# Patient Record
Sex: Female | Born: 1970
Health system: Southern US, Community
[De-identification: ages and names within clinical notes are randomized; demographics above are authoritative.]

## PROBLEM LIST (undated history)

## (undated) DIAGNOSIS — E7800 Pure hypercholesterolemia, unspecified: Secondary | ICD-10-CM

## (undated) DIAGNOSIS — K219 Gastro-esophageal reflux disease without esophagitis: Secondary | ICD-10-CM

## (undated) DIAGNOSIS — G249 Dystonia, unspecified: Secondary | ICD-10-CM

## (undated) DIAGNOSIS — F419 Anxiety disorder, unspecified: Secondary | ICD-10-CM

## (undated) DIAGNOSIS — G43909 Migraine, unspecified, not intractable, without status migrainosus: Secondary | ICD-10-CM

## (undated) DIAGNOSIS — F32A Depression, unspecified: Secondary | ICD-10-CM

## (undated) DIAGNOSIS — T4145XA Adverse effect of unspecified anesthetic, initial encounter: Secondary | ICD-10-CM

## (undated) DIAGNOSIS — M1990 Unspecified osteoarthritis, unspecified site: Secondary | ICD-10-CM

## (undated) DIAGNOSIS — E063 Autoimmune thyroiditis: Secondary | ICD-10-CM

## (undated) DIAGNOSIS — M069 Rheumatoid arthritis, unspecified: Secondary | ICD-10-CM

## (undated) DIAGNOSIS — T8859XA Other complications of anesthesia, initial encounter: Secondary | ICD-10-CM

## (undated) DIAGNOSIS — G245 Blepharospasm: Secondary | ICD-10-CM

## (undated) DIAGNOSIS — M797 Fibromyalgia: Secondary | ICD-10-CM

## (undated) DIAGNOSIS — F329 Major depressive disorder, single episode, unspecified: Secondary | ICD-10-CM

## (undated) HISTORY — DX: Blepharospasm: G24.5

## (undated) HISTORY — DX: Pure hypercholesterolemia, unspecified: E78.00

## (undated) HISTORY — DX: Anxiety disorder, unspecified: F41.9

## (undated) HISTORY — DX: Autoimmune thyroiditis: E06.3

## (undated) HISTORY — DX: Unspecified osteoarthritis, unspecified site: M19.90

## (undated) HISTORY — DX: Migraine, unspecified, not intractable, without status migrainosus: G43.909

## (undated) HISTORY — DX: Major depressive disorder, single episode, unspecified: F32.9

## (undated) HISTORY — DX: Fibromyalgia: M79.7

## (undated) HISTORY — DX: Depression, unspecified: F32.A

## (undated) HISTORY — DX: Dystonia, unspecified: G24.9

## (undated) HISTORY — DX: Rheumatoid arthritis, unspecified: M06.9

## (undated) HISTORY — DX: Gastro-esophageal reflux disease without esophagitis: K21.9

---

## 2003-10-09 HISTORY — PX: SPINE SURGERY: SHX786

## 2011-10-15 DIAGNOSIS — R259 Unspecified abnormal involuntary movements: Secondary | ICD-10-CM | POA: Diagnosis not present

## 2011-10-23 DIAGNOSIS — N390 Urinary tract infection, site not specified: Secondary | ICD-10-CM | POA: Diagnosis not present

## 2011-10-23 DIAGNOSIS — B373 Candidiasis of vulva and vagina: Secondary | ICD-10-CM | POA: Diagnosis not present

## 2011-10-30 DIAGNOSIS — Z3009 Encounter for other general counseling and advice on contraception: Secondary | ICD-10-CM | POA: Diagnosis not present

## 2011-10-30 DIAGNOSIS — R300 Dysuria: Secondary | ICD-10-CM | POA: Diagnosis not present

## 2011-11-27 DIAGNOSIS — G241 Genetic torsion dystonia: Secondary | ICD-10-CM | POA: Diagnosis not present

## 2011-12-24 DIAGNOSIS — G248 Other dystonia: Secondary | ICD-10-CM | POA: Diagnosis not present

## 2011-12-24 DIAGNOSIS — G243 Spasmodic torticollis: Secondary | ICD-10-CM | POA: Diagnosis not present

## 2011-12-24 DIAGNOSIS — M62838 Other muscle spasm: Secondary | ICD-10-CM | POA: Diagnosis not present

## 2011-12-24 DIAGNOSIS — R209 Unspecified disturbances of skin sensation: Secondary | ICD-10-CM | POA: Diagnosis not present

## 2011-12-27 DIAGNOSIS — M7989 Other specified soft tissue disorders: Secondary | ICD-10-CM | POA: Diagnosis not present

## 2011-12-27 DIAGNOSIS — M25579 Pain in unspecified ankle and joints of unspecified foot: Secondary | ICD-10-CM | POA: Diagnosis not present

## 2011-12-27 DIAGNOSIS — M79609 Pain in unspecified limb: Secondary | ICD-10-CM | POA: Diagnosis not present

## 2012-01-03 DIAGNOSIS — R259 Unspecified abnormal involuntary movements: Secondary | ICD-10-CM | POA: Diagnosis not present

## 2012-01-14 DIAGNOSIS — R209 Unspecified disturbances of skin sensation: Secondary | ICD-10-CM | POA: Diagnosis not present

## 2012-01-14 DIAGNOSIS — R259 Unspecified abnormal involuntary movements: Secondary | ICD-10-CM | POA: Diagnosis not present

## 2012-01-17 DIAGNOSIS — M5124 Other intervertebral disc displacement, thoracic region: Secondary | ICD-10-CM | POA: Diagnosis not present

## 2012-01-17 DIAGNOSIS — M5020 Other cervical disc displacement, unspecified cervical region: Secondary | ICD-10-CM | POA: Diagnosis not present

## 2012-01-29 DIAGNOSIS — R52 Pain, unspecified: Secondary | ICD-10-CM | POA: Diagnosis not present

## 2012-01-29 DIAGNOSIS — IMO0001 Reserved for inherently not codable concepts without codable children: Secondary | ICD-10-CM | POA: Diagnosis not present

## 2012-01-29 DIAGNOSIS — G248 Other dystonia: Secondary | ICD-10-CM | POA: Diagnosis not present

## 2012-01-30 DIAGNOSIS — M546 Pain in thoracic spine: Secondary | ICD-10-CM | POA: Diagnosis not present

## 2012-03-24 DIAGNOSIS — R259 Unspecified abnormal involuntary movements: Secondary | ICD-10-CM | POA: Diagnosis not present

## 2012-03-24 DIAGNOSIS — G8929 Other chronic pain: Secondary | ICD-10-CM | POA: Diagnosis not present

## 2012-03-24 DIAGNOSIS — M79609 Pain in unspecified limb: Secondary | ICD-10-CM | POA: Diagnosis not present

## 2012-03-24 DIAGNOSIS — F17200 Nicotine dependence, unspecified, uncomplicated: Secondary | ICD-10-CM | POA: Diagnosis not present

## 2012-04-03 DIAGNOSIS — R209 Unspecified disturbances of skin sensation: Secondary | ICD-10-CM | POA: Diagnosis not present

## 2012-04-03 DIAGNOSIS — G243 Spasmodic torticollis: Secondary | ICD-10-CM | POA: Diagnosis not present

## 2012-04-03 DIAGNOSIS — IMO0001 Reserved for inherently not codable concepts without codable children: Secondary | ICD-10-CM | POA: Diagnosis not present

## 2012-04-07 DIAGNOSIS — R209 Unspecified disturbances of skin sensation: Secondary | ICD-10-CM | POA: Diagnosis not present

## 2012-04-07 DIAGNOSIS — G243 Spasmodic torticollis: Secondary | ICD-10-CM | POA: Diagnosis not present

## 2012-04-07 DIAGNOSIS — IMO0001 Reserved for inherently not codable concepts without codable children: Secondary | ICD-10-CM | POA: Diagnosis not present

## 2012-04-14 DIAGNOSIS — IMO0001 Reserved for inherently not codable concepts without codable children: Secondary | ICD-10-CM | POA: Diagnosis not present

## 2012-04-14 DIAGNOSIS — R209 Unspecified disturbances of skin sensation: Secondary | ICD-10-CM | POA: Diagnosis not present

## 2012-04-14 DIAGNOSIS — G243 Spasmodic torticollis: Secondary | ICD-10-CM | POA: Diagnosis not present

## 2012-04-17 DIAGNOSIS — S8990XA Unspecified injury of unspecified lower leg, initial encounter: Secondary | ICD-10-CM | POA: Diagnosis not present

## 2012-04-17 DIAGNOSIS — S93609A Unspecified sprain of unspecified foot, initial encounter: Secondary | ICD-10-CM | POA: Diagnosis not present

## 2012-04-17 DIAGNOSIS — R259 Unspecified abnormal involuntary movements: Secondary | ICD-10-CM | POA: Diagnosis not present

## 2012-04-17 DIAGNOSIS — S99929A Unspecified injury of unspecified foot, initial encounter: Secondary | ICD-10-CM | POA: Diagnosis not present

## 2012-04-17 DIAGNOSIS — S93409A Sprain of unspecified ligament of unspecified ankle, initial encounter: Secondary | ICD-10-CM | POA: Diagnosis not present

## 2012-04-17 DIAGNOSIS — G241 Genetic torsion dystonia: Secondary | ICD-10-CM | POA: Diagnosis not present

## 2012-04-21 DIAGNOSIS — R209 Unspecified disturbances of skin sensation: Secondary | ICD-10-CM | POA: Diagnosis not present

## 2012-04-21 DIAGNOSIS — G243 Spasmodic torticollis: Secondary | ICD-10-CM | POA: Diagnosis not present

## 2012-04-21 DIAGNOSIS — IMO0001 Reserved for inherently not codable concepts without codable children: Secondary | ICD-10-CM | POA: Diagnosis not present

## 2012-04-29 DIAGNOSIS — R259 Unspecified abnormal involuntary movements: Secondary | ICD-10-CM | POA: Diagnosis not present

## 2012-04-29 DIAGNOSIS — G43909 Migraine, unspecified, not intractable, without status migrainosus: Secondary | ICD-10-CM | POA: Diagnosis not present

## 2012-05-06 DIAGNOSIS — M62838 Other muscle spasm: Secondary | ICD-10-CM | POA: Diagnosis not present

## 2012-05-06 DIAGNOSIS — IMO0001 Reserved for inherently not codable concepts without codable children: Secondary | ICD-10-CM | POA: Diagnosis not present

## 2012-05-06 DIAGNOSIS — G241 Genetic torsion dystonia: Secondary | ICD-10-CM | POA: Diagnosis not present

## 2012-05-12 DIAGNOSIS — R209 Unspecified disturbances of skin sensation: Secondary | ICD-10-CM | POA: Diagnosis not present

## 2012-05-12 DIAGNOSIS — IMO0001 Reserved for inherently not codable concepts without codable children: Secondary | ICD-10-CM | POA: Diagnosis not present

## 2012-05-12 DIAGNOSIS — G243 Spasmodic torticollis: Secondary | ICD-10-CM | POA: Diagnosis not present

## 2012-05-16 DIAGNOSIS — G243 Spasmodic torticollis: Secondary | ICD-10-CM | POA: Diagnosis not present

## 2012-05-20 DIAGNOSIS — S93409A Sprain of unspecified ligament of unspecified ankle, initial encounter: Secondary | ICD-10-CM | POA: Diagnosis not present

## 2012-05-20 DIAGNOSIS — R259 Unspecified abnormal involuntary movements: Secondary | ICD-10-CM | POA: Diagnosis not present

## 2012-07-28 DIAGNOSIS — Z79899 Other long term (current) drug therapy: Secondary | ICD-10-CM | POA: Diagnosis not present

## 2012-07-28 DIAGNOSIS — IMO0001 Reserved for inherently not codable concepts without codable children: Secondary | ICD-10-CM | POA: Diagnosis not present

## 2012-07-28 DIAGNOSIS — R5381 Other malaise: Secondary | ICD-10-CM | POA: Diagnosis not present

## 2012-07-28 DIAGNOSIS — G245 Blepharospasm: Secondary | ICD-10-CM | POA: Diagnosis not present

## 2012-07-28 DIAGNOSIS — R259 Unspecified abnormal involuntary movements: Secondary | ICD-10-CM | POA: Diagnosis not present

## 2012-07-28 DIAGNOSIS — G248 Other dystonia: Secondary | ICD-10-CM | POA: Diagnosis not present

## 2012-08-15 DIAGNOSIS — F4320 Adjustment disorder, unspecified: Secondary | ICD-10-CM | POA: Diagnosis not present

## 2012-08-15 DIAGNOSIS — F4542 Pain disorder with related psychological factors: Secondary | ICD-10-CM | POA: Diagnosis not present

## 2012-09-11 DIAGNOSIS — H52229 Regular astigmatism, unspecified eye: Secondary | ICD-10-CM | POA: Diagnosis not present

## 2012-09-11 DIAGNOSIS — Z0100 Encounter for examination of eyes and vision without abnormal findings: Secondary | ICD-10-CM | POA: Diagnosis not present

## 2012-09-11 DIAGNOSIS — H43399 Other vitreous opacities, unspecified eye: Secondary | ICD-10-CM | POA: Diagnosis not present

## 2012-09-15 DIAGNOSIS — F449 Dissociative and conversion disorder, unspecified: Secondary | ICD-10-CM | POA: Diagnosis not present

## 2012-09-16 DIAGNOSIS — G243 Spasmodic torticollis: Secondary | ICD-10-CM | POA: Diagnosis not present

## 2012-09-16 DIAGNOSIS — G245 Blepharospasm: Secondary | ICD-10-CM | POA: Diagnosis not present

## 2012-09-16 DIAGNOSIS — G248 Other dystonia: Secondary | ICD-10-CM | POA: Diagnosis not present

## 2012-09-17 DIAGNOSIS — F4542 Pain disorder with related psychological factors: Secondary | ICD-10-CM | POA: Diagnosis not present

## 2012-09-17 DIAGNOSIS — F4320 Adjustment disorder, unspecified: Secondary | ICD-10-CM | POA: Diagnosis not present

## 2012-09-23 DIAGNOSIS — F4542 Pain disorder with related psychological factors: Secondary | ICD-10-CM | POA: Diagnosis not present

## 2012-09-23 DIAGNOSIS — F4320 Adjustment disorder, unspecified: Secondary | ICD-10-CM | POA: Diagnosis not present

## 2012-09-24 DIAGNOSIS — F4320 Adjustment disorder, unspecified: Secondary | ICD-10-CM | POA: Diagnosis not present

## 2012-09-24 DIAGNOSIS — F4542 Pain disorder with related psychological factors: Secondary | ICD-10-CM | POA: Diagnosis not present

## 2012-10-09 DIAGNOSIS — R300 Dysuria: Secondary | ICD-10-CM | POA: Diagnosis not present

## 2012-10-09 DIAGNOSIS — Z3009 Encounter for other general counseling and advice on contraception: Secondary | ICD-10-CM | POA: Diagnosis not present

## 2012-10-30 DIAGNOSIS — IMO0001 Reserved for inherently not codable concepts without codable children: Secondary | ICD-10-CM | POA: Diagnosis not present

## 2012-10-30 DIAGNOSIS — G241 Genetic torsion dystonia: Secondary | ICD-10-CM | POA: Diagnosis not present

## 2012-10-30 DIAGNOSIS — Z79899 Other long term (current) drug therapy: Secondary | ICD-10-CM | POA: Diagnosis not present

## 2012-10-30 DIAGNOSIS — M62838 Other muscle spasm: Secondary | ICD-10-CM | POA: Diagnosis not present

## 2012-10-30 DIAGNOSIS — Z5181 Encounter for therapeutic drug level monitoring: Secondary | ICD-10-CM | POA: Diagnosis not present

## 2012-10-31 DIAGNOSIS — F4310 Post-traumatic stress disorder, unspecified: Secondary | ICD-10-CM | POA: Diagnosis not present

## 2012-10-31 DIAGNOSIS — F4542 Pain disorder with related psychological factors: Secondary | ICD-10-CM | POA: Diagnosis not present

## 2012-10-31 DIAGNOSIS — F331 Major depressive disorder, recurrent, moderate: Secondary | ICD-10-CM | POA: Diagnosis not present

## 2012-11-14 DIAGNOSIS — F4310 Post-traumatic stress disorder, unspecified: Secondary | ICD-10-CM | POA: Diagnosis not present

## 2012-11-14 DIAGNOSIS — F331 Major depressive disorder, recurrent, moderate: Secondary | ICD-10-CM | POA: Diagnosis not present

## 2012-11-14 DIAGNOSIS — F4542 Pain disorder with related psychological factors: Secondary | ICD-10-CM | POA: Diagnosis not present

## 2012-11-26 DIAGNOSIS — F4542 Pain disorder with related psychological factors: Secondary | ICD-10-CM | POA: Diagnosis not present

## 2012-11-26 DIAGNOSIS — F4310 Post-traumatic stress disorder, unspecified: Secondary | ICD-10-CM | POA: Diagnosis not present

## 2012-11-26 DIAGNOSIS — F331 Major depressive disorder, recurrent, moderate: Secondary | ICD-10-CM | POA: Diagnosis not present

## 2012-11-29 DIAGNOSIS — M79609 Pain in unspecified limb: Secondary | ICD-10-CM | POA: Diagnosis not present

## 2012-11-29 DIAGNOSIS — S93609A Unspecified sprain of unspecified foot, initial encounter: Secondary | ICD-10-CM | POA: Diagnosis not present

## 2012-12-04 DIAGNOSIS — F4542 Pain disorder with related psychological factors: Secondary | ICD-10-CM | POA: Diagnosis not present

## 2012-12-04 DIAGNOSIS — F331 Major depressive disorder, recurrent, moderate: Secondary | ICD-10-CM | POA: Diagnosis not present

## 2012-12-04 DIAGNOSIS — F4310 Post-traumatic stress disorder, unspecified: Secondary | ICD-10-CM | POA: Diagnosis not present

## 2012-12-26 DIAGNOSIS — F4542 Pain disorder with related psychological factors: Secondary | ICD-10-CM | POA: Diagnosis not present

## 2012-12-26 DIAGNOSIS — F331 Major depressive disorder, recurrent, moderate: Secondary | ICD-10-CM | POA: Diagnosis not present

## 2012-12-26 DIAGNOSIS — F4310 Post-traumatic stress disorder, unspecified: Secondary | ICD-10-CM | POA: Diagnosis not present

## 2013-01-02 DIAGNOSIS — F4310 Post-traumatic stress disorder, unspecified: Secondary | ICD-10-CM | POA: Diagnosis not present

## 2013-01-02 DIAGNOSIS — F331 Major depressive disorder, recurrent, moderate: Secondary | ICD-10-CM | POA: Diagnosis not present

## 2013-01-02 DIAGNOSIS — F4542 Pain disorder with related psychological factors: Secondary | ICD-10-CM | POA: Diagnosis not present

## 2013-01-09 DIAGNOSIS — F4542 Pain disorder with related psychological factors: Secondary | ICD-10-CM | POA: Diagnosis not present

## 2013-01-09 DIAGNOSIS — F331 Major depressive disorder, recurrent, moderate: Secondary | ICD-10-CM | POA: Diagnosis not present

## 2013-01-09 DIAGNOSIS — F4310 Post-traumatic stress disorder, unspecified: Secondary | ICD-10-CM | POA: Diagnosis not present

## 2013-01-12 DIAGNOSIS — R300 Dysuria: Secondary | ICD-10-CM | POA: Diagnosis not present

## 2013-01-12 DIAGNOSIS — R259 Unspecified abnormal involuntary movements: Secondary | ICD-10-CM | POA: Diagnosis not present

## 2013-01-12 DIAGNOSIS — F603 Borderline personality disorder: Secondary | ICD-10-CM | POA: Diagnosis not present

## 2013-01-12 DIAGNOSIS — Z79899 Other long term (current) drug therapy: Secondary | ICD-10-CM | POA: Diagnosis not present

## 2013-01-12 DIAGNOSIS — F341 Dysthymic disorder: Secondary | ICD-10-CM | POA: Diagnosis not present

## 2013-01-16 DIAGNOSIS — G243 Spasmodic torticollis: Secondary | ICD-10-CM | POA: Diagnosis not present

## 2013-01-16 DIAGNOSIS — G248 Other dystonia: Secondary | ICD-10-CM | POA: Diagnosis not present

## 2013-01-16 DIAGNOSIS — R209 Unspecified disturbances of skin sensation: Secondary | ICD-10-CM | POA: Diagnosis not present

## 2013-01-16 DIAGNOSIS — M62838 Other muscle spasm: Secondary | ICD-10-CM | POA: Diagnosis not present

## 2013-01-20 DIAGNOSIS — Z79899 Other long term (current) drug therapy: Secondary | ICD-10-CM | POA: Diagnosis not present

## 2013-01-20 DIAGNOSIS — R259 Unspecified abnormal involuntary movements: Secondary | ICD-10-CM | POA: Diagnosis not present

## 2013-01-20 DIAGNOSIS — IMO0001 Reserved for inherently not codable concepts without codable children: Secondary | ICD-10-CM | POA: Diagnosis not present

## 2013-01-23 DIAGNOSIS — F331 Major depressive disorder, recurrent, moderate: Secondary | ICD-10-CM | POA: Diagnosis not present

## 2013-01-23 DIAGNOSIS — F4542 Pain disorder with related psychological factors: Secondary | ICD-10-CM | POA: Diagnosis not present

## 2013-01-23 DIAGNOSIS — F603 Borderline personality disorder: Secondary | ICD-10-CM | POA: Diagnosis not present

## 2013-01-23 DIAGNOSIS — F4310 Post-traumatic stress disorder, unspecified: Secondary | ICD-10-CM | POA: Diagnosis not present

## 2013-02-06 DIAGNOSIS — F4310 Post-traumatic stress disorder, unspecified: Secondary | ICD-10-CM | POA: Diagnosis not present

## 2013-02-06 DIAGNOSIS — F4542 Pain disorder with related psychological factors: Secondary | ICD-10-CM | POA: Diagnosis not present

## 2013-02-06 DIAGNOSIS — F331 Major depressive disorder, recurrent, moderate: Secondary | ICD-10-CM | POA: Diagnosis not present

## 2013-02-09 DIAGNOSIS — R636 Underweight: Secondary | ICD-10-CM | POA: Diagnosis not present

## 2013-02-18 DIAGNOSIS — F331 Major depressive disorder, recurrent, moderate: Secondary | ICD-10-CM | POA: Diagnosis not present

## 2013-02-18 DIAGNOSIS — F4310 Post-traumatic stress disorder, unspecified: Secondary | ICD-10-CM | POA: Diagnosis not present

## 2013-02-18 DIAGNOSIS — F4542 Pain disorder with related psychological factors: Secondary | ICD-10-CM | POA: Diagnosis not present

## 2013-02-27 DIAGNOSIS — F4310 Post-traumatic stress disorder, unspecified: Secondary | ICD-10-CM | POA: Diagnosis not present

## 2013-02-27 DIAGNOSIS — F331 Major depressive disorder, recurrent, moderate: Secondary | ICD-10-CM | POA: Diagnosis not present

## 2013-02-27 DIAGNOSIS — F603 Borderline personality disorder: Secondary | ICD-10-CM | POA: Diagnosis not present

## 2013-02-27 DIAGNOSIS — F4542 Pain disorder with related psychological factors: Secondary | ICD-10-CM | POA: Diagnosis not present

## 2013-03-10 DIAGNOSIS — R636 Underweight: Secondary | ICD-10-CM | POA: Diagnosis not present

## 2013-03-10 DIAGNOSIS — R259 Unspecified abnormal involuntary movements: Secondary | ICD-10-CM | POA: Diagnosis not present

## 2013-03-20 DIAGNOSIS — F331 Major depressive disorder, recurrent, moderate: Secondary | ICD-10-CM | POA: Diagnosis not present

## 2013-03-20 DIAGNOSIS — F4310 Post-traumatic stress disorder, unspecified: Secondary | ICD-10-CM | POA: Diagnosis not present

## 2013-03-20 DIAGNOSIS — F4542 Pain disorder with related psychological factors: Secondary | ICD-10-CM | POA: Diagnosis not present

## 2013-03-27 DIAGNOSIS — F4542 Pain disorder with related psychological factors: Secondary | ICD-10-CM | POA: Diagnosis not present

## 2013-03-27 DIAGNOSIS — F331 Major depressive disorder, recurrent, moderate: Secondary | ICD-10-CM | POA: Diagnosis not present

## 2013-03-27 DIAGNOSIS — F4310 Post-traumatic stress disorder, unspecified: Secondary | ICD-10-CM | POA: Diagnosis not present

## 2013-04-08 DIAGNOSIS — F331 Major depressive disorder, recurrent, moderate: Secondary | ICD-10-CM | POA: Diagnosis not present

## 2013-04-08 DIAGNOSIS — F4310 Post-traumatic stress disorder, unspecified: Secondary | ICD-10-CM | POA: Diagnosis not present

## 2013-04-08 DIAGNOSIS — F4542 Pain disorder with related psychological factors: Secondary | ICD-10-CM | POA: Diagnosis not present

## 2013-04-21 DIAGNOSIS — IMO0001 Reserved for inherently not codable concepts without codable children: Secondary | ICD-10-CM | POA: Diagnosis not present

## 2013-04-21 DIAGNOSIS — G241 Genetic torsion dystonia: Secondary | ICD-10-CM | POA: Diagnosis not present

## 2013-04-24 DIAGNOSIS — F4310 Post-traumatic stress disorder, unspecified: Secondary | ICD-10-CM | POA: Diagnosis not present

## 2013-04-24 DIAGNOSIS — F331 Major depressive disorder, recurrent, moderate: Secondary | ICD-10-CM | POA: Diagnosis not present

## 2013-04-24 DIAGNOSIS — F4542 Pain disorder with related psychological factors: Secondary | ICD-10-CM | POA: Diagnosis not present

## 2013-05-01 DIAGNOSIS — F4542 Pain disorder with related psychological factors: Secondary | ICD-10-CM | POA: Diagnosis not present

## 2013-05-01 DIAGNOSIS — F4310 Post-traumatic stress disorder, unspecified: Secondary | ICD-10-CM | POA: Diagnosis not present

## 2013-05-01 DIAGNOSIS — F331 Major depressive disorder, recurrent, moderate: Secondary | ICD-10-CM | POA: Diagnosis not present

## 2013-05-12 DIAGNOSIS — G542 Cervical root disorders, not elsewhere classified: Secondary | ICD-10-CM | POA: Diagnosis not present

## 2013-05-12 DIAGNOSIS — Z124 Encounter for screening for malignant neoplasm of cervix: Secondary | ICD-10-CM | POA: Diagnosis not present

## 2013-05-12 DIAGNOSIS — F331 Major depressive disorder, recurrent, moderate: Secondary | ICD-10-CM | POA: Diagnosis not present

## 2013-05-12 DIAGNOSIS — R259 Unspecified abnormal involuntary movements: Secondary | ICD-10-CM | POA: Diagnosis not present

## 2013-05-12 DIAGNOSIS — R946 Abnormal results of thyroid function studies: Secondary | ICD-10-CM | POA: Diagnosis not present

## 2013-05-12 DIAGNOSIS — Z79899 Other long term (current) drug therapy: Secondary | ICD-10-CM | POA: Diagnosis not present

## 2013-05-12 DIAGNOSIS — F4542 Pain disorder with related psychological factors: Secondary | ICD-10-CM | POA: Diagnosis not present

## 2013-05-12 DIAGNOSIS — F4310 Post-traumatic stress disorder, unspecified: Secondary | ICD-10-CM | POA: Diagnosis not present

## 2013-05-12 DIAGNOSIS — Z5181 Encounter for therapeutic drug level monitoring: Secondary | ICD-10-CM | POA: Diagnosis not present

## 2013-05-12 DIAGNOSIS — R87613 High grade squamous intraepithelial lesion on cytologic smear of cervix (HGSIL): Secondary | ICD-10-CM | POA: Diagnosis not present

## 2013-05-12 DIAGNOSIS — M546 Pain in thoracic spine: Secondary | ICD-10-CM | POA: Diagnosis not present

## 2013-05-13 DIAGNOSIS — L608 Other nail disorders: Secondary | ICD-10-CM | POA: Diagnosis not present

## 2013-05-13 DIAGNOSIS — M2010 Hallux valgus (acquired), unspecified foot: Secondary | ICD-10-CM | POA: Diagnosis not present

## 2013-05-13 DIAGNOSIS — B351 Tinea unguium: Secondary | ICD-10-CM | POA: Diagnosis not present

## 2013-05-15 DIAGNOSIS — IMO0002 Reserved for concepts with insufficient information to code with codable children: Secondary | ICD-10-CM | POA: Diagnosis not present

## 2013-05-15 DIAGNOSIS — M5412 Radiculopathy, cervical region: Secondary | ICD-10-CM | POA: Diagnosis not present

## 2013-05-15 DIAGNOSIS — R259 Unspecified abnormal involuntary movements: Secondary | ICD-10-CM | POA: Diagnosis not present

## 2013-05-15 DIAGNOSIS — M546 Pain in thoracic spine: Secondary | ICD-10-CM | POA: Diagnosis not present

## 2013-05-15 DIAGNOSIS — M5124 Other intervertebral disc displacement, thoracic region: Secondary | ICD-10-CM | POA: Diagnosis not present

## 2013-05-15 DIAGNOSIS — M5020 Other cervical disc displacement, unspecified cervical region: Secondary | ICD-10-CM | POA: Diagnosis not present

## 2013-05-15 DIAGNOSIS — M47814 Spondylosis without myelopathy or radiculopathy, thoracic region: Secondary | ICD-10-CM | POA: Diagnosis not present

## 2013-05-15 DIAGNOSIS — M5030 Other cervical disc degeneration, unspecified cervical region: Secondary | ICD-10-CM | POA: Diagnosis not present

## 2013-05-19 DIAGNOSIS — G248 Other dystonia: Secondary | ICD-10-CM | POA: Diagnosis not present

## 2013-05-19 DIAGNOSIS — R209 Unspecified disturbances of skin sensation: Secondary | ICD-10-CM | POA: Diagnosis not present

## 2013-05-19 DIAGNOSIS — M62838 Other muscle spasm: Secondary | ICD-10-CM | POA: Diagnosis not present

## 2013-05-19 DIAGNOSIS — G243 Spasmodic torticollis: Secondary | ICD-10-CM | POA: Diagnosis not present

## 2013-05-19 DIAGNOSIS — R87619 Unspecified abnormal cytological findings in specimens from cervix uteri: Secondary | ICD-10-CM | POA: Diagnosis not present

## 2013-05-22 DIAGNOSIS — F331 Major depressive disorder, recurrent, moderate: Secondary | ICD-10-CM | POA: Diagnosis not present

## 2013-05-22 DIAGNOSIS — F4310 Post-traumatic stress disorder, unspecified: Secondary | ICD-10-CM | POA: Diagnosis not present

## 2013-05-22 DIAGNOSIS — F4542 Pain disorder with related psychological factors: Secondary | ICD-10-CM | POA: Diagnosis not present

## 2013-05-28 DIAGNOSIS — G248 Other dystonia: Secondary | ICD-10-CM | POA: Diagnosis not present

## 2013-05-28 DIAGNOSIS — G243 Spasmodic torticollis: Secondary | ICD-10-CM | POA: Diagnosis not present

## 2013-05-29 DIAGNOSIS — F331 Major depressive disorder, recurrent, moderate: Secondary | ICD-10-CM | POA: Diagnosis not present

## 2013-05-29 DIAGNOSIS — F4542 Pain disorder with related psychological factors: Secondary | ICD-10-CM | POA: Diagnosis not present

## 2013-05-29 DIAGNOSIS — F603 Borderline personality disorder: Secondary | ICD-10-CM | POA: Diagnosis not present

## 2013-05-29 DIAGNOSIS — F4310 Post-traumatic stress disorder, unspecified: Secondary | ICD-10-CM | POA: Diagnosis not present

## 2013-06-05 DIAGNOSIS — F4542 Pain disorder with related psychological factors: Secondary | ICD-10-CM | POA: Diagnosis not present

## 2013-06-05 DIAGNOSIS — F331 Major depressive disorder, recurrent, moderate: Secondary | ICD-10-CM | POA: Diagnosis not present

## 2013-06-05 DIAGNOSIS — F4310 Post-traumatic stress disorder, unspecified: Secondary | ICD-10-CM | POA: Diagnosis not present

## 2013-06-23 DIAGNOSIS — G241 Genetic torsion dystonia: Secondary | ICD-10-CM | POA: Diagnosis not present

## 2013-06-29 DIAGNOSIS — G241 Genetic torsion dystonia: Secondary | ICD-10-CM | POA: Diagnosis not present

## 2013-06-29 DIAGNOSIS — IMO0001 Reserved for inherently not codable concepts without codable children: Secondary | ICD-10-CM | POA: Diagnosis not present

## 2013-07-02 DIAGNOSIS — Z23 Encounter for immunization: Secondary | ICD-10-CM | POA: Diagnosis not present

## 2013-07-15 DIAGNOSIS — M722 Plantar fascial fibromatosis: Secondary | ICD-10-CM | POA: Diagnosis not present

## 2013-07-15 DIAGNOSIS — M2060 Acquired deformities of toe(s), unspecified, unspecified foot: Secondary | ICD-10-CM | POA: Diagnosis not present

## 2013-07-30 ENCOUNTER — Encounter: Payer: Self-pay | Admitting: *Deleted

## 2013-08-27 ENCOUNTER — Other Ambulatory Visit (HOSPITAL_COMMUNITY)
Admission: RE | Admit: 2013-08-27 | Discharge: 2013-08-27 | Disposition: A | Discharge status: Home / Self Care | Payer: Medicare Other | Source: Ambulatory Visit | Attending: Obstetrics & Gynecology | Admitting: Obstetrics & Gynecology

## 2013-08-27 ENCOUNTER — Ambulatory Visit (INDEPENDENT_AMBULATORY_CARE_PROVIDER_SITE_OTHER): Payer: Medicare Other | Admitting: Obstetrics & Gynecology

## 2013-08-27 ENCOUNTER — Encounter: Payer: Self-pay | Admitting: *Deleted

## 2013-08-27 ENCOUNTER — Encounter: Payer: Self-pay | Admitting: Obstetrics & Gynecology

## 2013-08-27 VITALS — BP 116/76 | HR 92 | Temp 97.9°F

## 2013-08-27 DIAGNOSIS — N879 Dysplasia of cervix uteri, unspecified: Secondary | ICD-10-CM

## 2013-08-27 DIAGNOSIS — Z01812 Encounter for preprocedural laboratory examination: Secondary | ICD-10-CM | POA: Diagnosis not present

## 2013-08-27 DIAGNOSIS — D390 Neoplasm of uncertain behavior of uterus: Secondary | ICD-10-CM | POA: Diagnosis not present

## 2013-08-27 LAB — POCT PREGNANCY, URINE: Preg Test, Ur: NEGATIVE

## 2013-08-27 NOTE — Progress Notes (Signed)
Patient ID: Megan Hodges, female   DOB: 1970/11/14, 42 y.o.   MRN: [id number] Patient given informed consent, signed copy in the chart, time out was performed.  Placed in lithotomy position. Cervix viewed with speculum and colposcope after application of acetic acid.  05/12/2013 HGSIL  Colposcopy adequate?  yes Acetowhite lesions?yes at TZ 3:00 Punctation?no Mosaicism?  no Abnormal vasculature? 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  She will return in 4 weeks for results or LEEP if needed.  Pt watched the LEEP instruction video prior to leaving the office.    Gevork Ayyad L. Harraway-Smith, M.D., [place]

## 2013-08-27 NOTE — Patient Instructions (Signed)
Abnormal Pap Test Information During a Pap test, the cells on the surface of your cervix are checked to see if they look normal, abnormal, or if they show signs of having been altered by a certain type of virus called human papillomavirus, or HPV. Cervical cells that have been affected by HPV are called dysplasia. Dysplasia is not cancer, but describes abnormal cells found on the surface of the cervix. Depending on the degree of dysplasia, some of the cells may be considered pre-cancerous and may turn into cancer over time if follow up with a caregiver is delayed.  WHAT DOES AN ABNORMAL PAP TEST MEAN? Having an abnormal pap test does not mean that you have cancer. However, certain types of abnormal pap tests can be a sign that a person is at a higher risk of developing cancer. Your caregiver will want to do other tests to find out more about the abnormal cells. Your abnormal Pap test results could show:   Small and uncertain changes that should be carefully watched.   Cervical dysplasia that has caused mild changes and can be followed over time.  Cervical dysplasia that is more severe and needs to be followed and treated to ensure the problem goes away.  Cancer.  When severe cervical dysplasia is found and treated early, it rarely will grow into cancer.  WHAT WILL BE DONE ABOUT MY ABNORMAL PAP TEST?  A colposcopy may be needed. This is a procedure where your cervix is examined using light and magnification.  A small tissue sample of your cervix (biopsy) may need to be removed and then examined. This is often performed if there are areas that appear infected.  A sample of cells from the cervical canal may be removed with either a small brush or scraping instrument (curette). Based on the results of the procedures above, some caregivers may recommend either cryotherapy of the cervix or a surgical LEEP where a portion of the cervix is removed. LEEP is short for "loop electrical excisional  procedure." Rarely, a caregiver may recommend a cone biopsy.This is a procedure where a small, cone-shaped sample of your cervix is taken out. The part that is taken out is the area where the abnormal cells are.  WHAT IF I HAVE A DYSPLASIA OR A CANCER? You may be referred to a specialist. Radiation may also be a treatment for more advanced cancer. Having a hysterectomy is the last treatment option for dysplasia, but it is a more common treatment for someone with cancer. All treatment options will be discussed with you by your caregiver. WHAT SHOULD YOU DO AFTER BEING TREATED? If you have had an abnormal pap test, you should continue to have regular pap tests and check-ups as directed by your caregiver. Your cervical problem will be carefully watched so it does not get worse. Also, your caregiver can watch for, and treat, any new problems that may come up. Document Released: 01/09/2011 Document Revised: 01/19/2013 Document Reviewed: 09/20/2011 [facility] Patient Information 2014 [place], [place]. Cervical Dysplasia Cervical dysplasia is a condition in which a woman has abnormal changes in the cells of her cervix. The cervix is the opening to the uterus (womb). It is located between the vagina and the uterus. Cervical dysplasia may be the first sign of cervical cancer.  With early detection, treatment, and close follow-up care, nearly all cases of cervical dysplasia can be cured. If left untreated, dysplasia may become more severe.  CAUSES  Cervical dysplasia can be caused by a human papillomavirus (HPV)  infection. RISK FACTORS   Having had a sexually transmitted disease, such as chlamydia or a human papillomavirus (HPV) infection.   Becoming sexually active before age 110.   Having had more than 1 sexual partner.   Not using protection during sexual intercourse, especially with new sexual partners.   Having had cancer of the vagina or vulva.   Having a sexual partner whose previous partner  had cancer of the cervix or cervical dysplasia.   Having a sexual partner who has or has had cancer of the penis.   Having a weakened immune system (such as from having HIV or an organ transplant).   Being the daughter of a woman who took diethylstilbestrol(DES) during pregnancy.   Having a family history of cervical cancer.   Smoking. SIGNS AND SYMPTOMS  There are usually no symptoms. If there are symptoms, they may include:   Abnormal vaginal discharge.   Bleeding between periods or after intercourse.   Bleeding during menopause.   Pain during sexual intercourse (dyspareunia). DIAGNOSIS  A test called a Pap test may be done.During this test, cells are taken from the cervix and then looked at under a microscope. A test in which tissue is removed from the cervix (biopsy) may also be done if the Pap test is abnormal or if the cervix looks abnormal.  TREATMENT  Treatment varies based on the severity of the cervical dysplasia. Treatment may include:  Cryotherapy. During cryotherapy, the abnormal cells are frozen with a steel-tip instrument.   A procedure to remove abnormal tissue from the cervix.  Surgery to remove abnormal tissue. This is usually done in serious cases of cervical dysplasia. Surgical options include:  A cone biopsy. This is a procedure in which the cervical canal and a portion of the center of the cervix are removed.   Hysterectomy. This is a surgery in which the uterus and cervix are removed. HOME CARE INSTRUCTIONS   Only take over-the-counter or prescription medicines for pain or discomfort as directed by your health care provider.   Do not use tampons, have sexual intercourse, or douche until your health care provider says it is OK.  Keep follow-up appointments as directed by your health care provider. Women who have been treated for cervical dysplasia should have regular pelvic exams and Pap tests. During the first year following treatment of  cervical dysplasia, Pap tests should be done every 3 4 months. In the second year, they should be done every 6 months or as recommended by your health care provider.  To prevent the condition from developing again, practice safe sex. SEEK MEDICAL CARE IF:  You develop genital warts.  SEEK IMMEDIATE MEDICAL CARE IF:   Your menstrual period is heavier than normal.   You develop bright red bleeding, especially if you have blood clots.   You have a fever.   You have increasing cramps or pain not relieved with medicine.   You are lightheaded, unusually weak, or have fainting spells.   You have abnormal vaginal discharge.   You have abdominal pain. Document Released: 09/24/2005 Document Revised: 05/27/2013 Document Reviewed: 05/20/2013 [facility] Patient Information 2014 [place], [place]. Loop Electrosurgical Excision Procedure Loop electrosurgical excision procedure (LEEP) is the removal of a portion of the lower part of the uterus (cervix). The procedure is done when there are significantly abnormal cervical cell changes. Abnormal cell changes of the cervix can lead to cancer if left in place and untreated.  The LEEP procedure itself typically only takes a few minutes.  Often, it may be done in your caregiver's office. The procedure is considered safe for those who wish to get pregnant or are trying to get pregnant. Only under rare circumstances should this procedure be done if you are pregnant. LET YOUR CAREGIVER KNOW ABOUT:  Whether you are pregnant or late for your last menstrual period.  Allergies to foods or medicines.  All the medicines you are taking includingherbs, eyedrops, and over-the-counter medicines, and creams.  Use of steroids (by mouth or creams).  Previous problems with anesthetics or numbing medicine.  Previous gynecological surgery.  History of blood clots or bleeding problems.  Any recent or current vaginal infections (herpes, sexually transmitted  infections).  Other health problems. RISKS AND COMPLICATIONS  Bleeding.  Infection.  Injury to the vagina, bladder, or rectum.  Very rare obstruction of the cervical opening that causes problems during menstruation (cervical stenosis). BEFORE THE PROCEDURE  Do not take aspirin or blood thinners (anticoagulants) for 1 week before the procedure, or as told by your caregiver.  Eat a light meal before the procedure.  Ask your caregiver about changing or stopping your regular medicines.  You may be given a pain reliever 1 or 2 hours before the procedure. PROCEDURE   A tool (speculum) is placed in the vagina. This allows your caregiver to see the cervix.  An iodine stain is applied to the cervix to find the area of abnormal cells to be removed.  Medicine is injected to numb the cervix (local anesthetic).   Electricity is passed through a thin wire loop which is then used to remove (cauterize) a small segment of the affected cervix.  Light electrocautery is used to seal any small blood vessels and prevent bleeding.  A paste may be applied to the cauterized area of the cervix to help prevent bleeding.  The tissue sample is sent to the lab. It is examined under the microscope. AFTER THE PROCEDURE  Have someone drive you home.  You may have slight to moderate cramping.  You may notice a black vaginal discharge from the paste used on the cervix to prevent bleeding. This is normal.  Watch for excessive bleeding. This requires immediate medical care.  Ask when your test results will be ready. Make sure you get your test results. Document Released: 12/15/2002 Document Revised: 12/17/2011 Document Reviewed: 03/06/2011 [facility] Patient Information 2014 [place], [place].

## 2013-08-28 DIAGNOSIS — G243 Spasmodic torticollis: Secondary | ICD-10-CM | POA: Diagnosis not present

## 2013-08-28 DIAGNOSIS — G248 Other dystonia: Secondary | ICD-10-CM | POA: Diagnosis not present

## 2013-09-02 ENCOUNTER — Telehealth: Payer: Self-pay | Admitting: *Deleted

## 2013-09-02 NOTE — Telephone Encounter (Signed)
Message copied by Gerome Apley on Wed Sep 02, 2013 11:50 AM ------      Message from: Willodean Rosenthal      Created: Wed Sep 02, 2013 11:37 AM       Please call pt.  ALL of her cervical biopsies were negative for dysplasia.  She needs a f/u PAP in 1 year.  No LEEP needed            clh-S ------

## 2013-09-02 NOTE — Telephone Encounter (Signed)
Called Megan Hodges and notifed her per Dr. Burnice Logan- Katrinka Blazing all biopsies negative and needs follow up pap in one year.   Call and make an appt 2 months before due as we can not schedule that far out now. [name] voices understanding.

## 2013-09-07 ENCOUNTER — Encounter: Payer: Self-pay | Admitting: *Deleted

## 2013-09-08 ENCOUNTER — Telehealth: Payer: Self-pay

## 2013-09-08 NOTE — Telephone Encounter (Signed)
Message copied by Faythe Casa on Tue Sep 08, 2013 11:20 AM ------      Message from: Faythe Casa      Created: Fri Aug 28, 2013  9:16 AM      Regarding: Mail lab work       Pt wants [place] to mail her results to her address.  ROI already filled out ------

## 2013-09-08 NOTE — Telephone Encounter (Signed)
Called pt and left message informing pt that I mailed her results of her colposcopy as she has asked and that they were normal.  If she has any questions to please give [place] a call back.

## 2013-09-10 ENCOUNTER — Encounter: Payer: Self-pay | Admitting: *Deleted

## 2013-09-18 ENCOUNTER — Encounter: Payer: Self-pay | Admitting: *Deleted

## 2013-09-24 DIAGNOSIS — M629 Disorder of muscle, unspecified: Secondary | ICD-10-CM | POA: Diagnosis not present

## 2013-09-24 DIAGNOSIS — IMO0001 Reserved for inherently not codable concepts without codable children: Secondary | ICD-10-CM | POA: Diagnosis not present

## 2013-09-24 DIAGNOSIS — Z79899 Other long term (current) drug therapy: Secondary | ICD-10-CM | POA: Diagnosis not present

## 2013-09-24 DIAGNOSIS — Z5181 Encounter for therapeutic drug level monitoring: Secondary | ICD-10-CM | POA: Diagnosis not present

## 2013-10-01 DIAGNOSIS — R52 Pain, unspecified: Secondary | ICD-10-CM | POA: Diagnosis not present

## 2013-10-01 DIAGNOSIS — Z8781 Personal history of (healed) traumatic fracture: Secondary | ICD-10-CM | POA: Diagnosis not present

## 2013-10-01 DIAGNOSIS — Z79899 Other long term (current) drug therapy: Secondary | ICD-10-CM | POA: Diagnosis not present

## 2013-10-01 DIAGNOSIS — M62838 Other muscle spasm: Secondary | ICD-10-CM | POA: Diagnosis not present

## 2013-10-01 DIAGNOSIS — S139XXA Sprain of joints and ligaments of unspecified parts of neck, initial encounter: Secondary | ICD-10-CM | POA: Diagnosis not present

## 2013-10-01 DIAGNOSIS — M549 Dorsalgia, unspecified: Secondary | ICD-10-CM | POA: Diagnosis not present

## 2013-10-01 DIAGNOSIS — IMO0002 Reserved for concepts with insufficient information to code with codable children: Secondary | ICD-10-CM | POA: Diagnosis not present

## 2013-10-24 DIAGNOSIS — N390 Urinary tract infection, site not specified: Secondary | ICD-10-CM | POA: Diagnosis not present

## 2013-11-03 DIAGNOSIS — G248 Other dystonia: Secondary | ICD-10-CM | POA: Diagnosis not present

## 2013-11-03 DIAGNOSIS — M542 Cervicalgia: Secondary | ICD-10-CM | POA: Diagnosis not present

## 2013-11-03 DIAGNOSIS — Z79899 Other long term (current) drug therapy: Secondary | ICD-10-CM | POA: Diagnosis not present

## 2013-11-03 DIAGNOSIS — M25519 Pain in unspecified shoulder: Secondary | ICD-10-CM | POA: Diagnosis not present

## 2013-11-03 DIAGNOSIS — M5030 Other cervical disc degeneration, unspecified cervical region: Secondary | ICD-10-CM | POA: Diagnosis not present

## 2013-11-18 DIAGNOSIS — M5030 Other cervical disc degeneration, unspecified cervical region: Secondary | ICD-10-CM | POA: Diagnosis not present

## 2013-11-18 DIAGNOSIS — G248 Other dystonia: Secondary | ICD-10-CM | POA: Diagnosis not present

## 2013-11-18 DIAGNOSIS — M62838 Other muscle spasm: Secondary | ICD-10-CM | POA: Diagnosis not present

## 2013-11-30 ENCOUNTER — Encounter: Payer: Self-pay | Admitting: Internal Medicine

## 2013-11-30 ENCOUNTER — Ambulatory Visit: Payer: Medicare Other | Attending: Internal Medicine | Admitting: Internal Medicine

## 2013-11-30 VITALS — BP 130/85 | HR 101 | Temp 98.8°F | Resp 16 | Ht 65.0 in | Wt 118.0 lb

## 2013-11-30 DIAGNOSIS — G248 Other dystonia: Secondary | ICD-10-CM | POA: Insufficient documentation

## 2013-11-30 DIAGNOSIS — G249 Dystonia, unspecified: Secondary | ICD-10-CM | POA: Insufficient documentation

## 2013-11-30 DIAGNOSIS — Z008 Encounter for other general examination: Secondary | ICD-10-CM | POA: Diagnosis not present

## 2013-11-30 DIAGNOSIS — Z79899 Other long term (current) drug therapy: Secondary | ICD-10-CM | POA: Insufficient documentation

## 2013-11-30 DIAGNOSIS — G43909 Migraine, unspecified, not intractable, without status migrainosus: Secondary | ICD-10-CM | POA: Insufficient documentation

## 2013-11-30 DIAGNOSIS — R259 Unspecified abnormal involuntary movements: Secondary | ICD-10-CM

## 2013-11-30 DIAGNOSIS — F17200 Nicotine dependence, unspecified, uncomplicated: Secondary | ICD-10-CM | POA: Diagnosis not present

## 2013-11-30 DIAGNOSIS — Z993 Dependence on wheelchair: Secondary | ICD-10-CM | POA: Insufficient documentation

## 2013-11-30 MED ORDER — RIZATRIPTAN BENZOATE 10 MG PO TABS: 10.0000 mg | ORAL_TABLET | ORAL | Status: DC | PRN

## 2013-11-30 MED ORDER — LEVONORGEST-ETH ESTRAD 91-DAY 0.15-0.03 &0.01 MG PO TABS: 1.0000 | ORAL_TABLET | Freq: Every day | ORAL | Status: DC

## 2013-11-30 MED ORDER — ONDANSETRON HCL 4 MG PO TABS: 4.0000 mg | ORAL_TABLET | Freq: Three times a day (TID) | ORAL | Status: AC | PRN

## 2013-11-30 NOTE — Progress Notes (Signed)
Patient ID: Megan Hodges, female   DOB: [birth year], 43 y.o.   MRN: [id number]   Vasilisa Vore, is a 43 y.o. female  HYQ:[id number]  [id number]  DOB - 25-Oct-1970  CC:  Chief Complaint  Patient presents with  . Establish Care       HPI: Samyra Limb is a 43 y.o. female here today to establish medical care. 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 When given shots of dopamine he began to walk and she is doing much better. Her medication list as below. She has had a neurologist and family care physician in [place]. She would like to continue followup with her neurologist but change of primary care physician now that she relocated to [facility]. She has no complaint today, she is only here to establish medical care and to get a refill on her medications. She gets her pain medication from a pain clinic in [place], and she gets some other medications from her neurologists. She does have a monthly shot as well as a refill on her by mouth medications. She has copies of her medical records with her. Her laboratory tests are up-to-date were reviewed. Patient smokes heavily about one pack of cigarettes per day. She uses a birth control pill to reduce migraine headache symptom. Patient has No headache, No chest pain, No abdominal pain - No Nausea, No new weakness tingling or numbness, No Cough - SOB.  Allergies  Allergen Reactions  . Epinephrine   . Lidocaine    Past Medical History  Diagnosis Date  . Generalized dystonia    Current Outpatient Prescriptions on File Prior to Visit  Medication Sig Dispense Refill  . baclofen (LIORESAL) 20 MG tablet Take 20 mg by mouth 4 (four) times daily.      . carbidopa-levodopa-entacapone (STALEVO) 25-100-200 MG per tablet Take 1 tablet by mouth 4 (four) times daily.      . dantrolene (DANTRIUM) 50 MG capsule  Take 50 mg by mouth 4 (four) times daily.      . diazepam (VALIUM) 10 MG tablet Take 10 mg by mouth every 6 (six) hours as needed for anxiety.      Marland Kitchen HYDROmorphone (DILAUDID) 2 MG tablet Take by mouth every 4 (four) hours as needed for severe pain.      Marland Kitchen HYDROmorphone HCl (EXALGO) 12 MG T24A SR tablet Take 16 mg by mouth daily.      . pramipexole (MIRAPEX) 0.125 MG tablet Take 0.125 mg by mouth 3 (three) times daily.       No current facility-administered medications on file prior to visit.   History reviewed. No pertinent family history. History   Social History  . Marital Status: Unknown    Spouse Name: N/A    Number of Children: N/A  . Years of Education: N/A   Occupational History  . Not on file.   Social History Main Topics  . Smoking status: Current Every Day Smoker -- 1.00 packs/day    Types: Cigarettes  . Smokeless tobacco: Never Used  . Alcohol Use: No  . Drug Use: No  . Sexual Activity: Not Currently   Other Topics Concern  . Not on file   Social History Narrative  . No narrative on file    Review of Systems: Constitutional: Negative for fever, chills, diaphoresis, activity change, appetite change and fatigue. HENT: Negative for ear pain, nosebleeds, congestion, facial  swelling, rhinorrhea, neck pain, neck stiffness and ear discharge.  Eyes: Negative for pain, discharge, redness, itching and visual disturbance. Respiratory: Negative for cough, choking, chest tightness, shortness of breath, wheezing and stridor.  Cardiovascular: Negative for chest pain, palpitations and leg swelling. Gastrointestinal: Negative for abdominal distention. Genitourinary: Negative for dysuria, urgency, frequency, hematuria, flank pain, decreased urine volume, difficulty urinating and dyspareunia.  Musculoskeletal: Gait abnormality Neurological: Negative for dizziness, tremors, seizures, syncope, facial asymmetry, speech difficulty, weakness, light-headedness, numbness and headaches.    Hematological: Negative for adenopathy. Does not bruise/bleed easily. Psychiatric/Behavioral: Negative for hallucinations, behavioral problems, confusion, dysphoric mood, decreased concentration and agitation.    Objective:   Filed Vitals:   11/30/13 1025  BP: 130/85  Pulse: 101  Temp: 98.8 F (37.1 C)  Resp: 16    Physical Exam: Constitutional: Patient appears well-developed and well-nourished. No distress. Talkative HENT: Normocephalic, atraumatic, External right and left ear normal. Oropharynx is clear and moist.  Eyes: Conjunctivae and EOM are normal. PERRLA, no scleral icterus. Neck: Normal ROM. Neck supple. No JVD. No tracheal deviation. No thyromegaly. CVS: RRR, S1/S2 +, no murmurs, no gallops, no carotid bruit.  Pulmonary: Effort and breath sounds normal, no stridor, rhonchi, wheezes, rales.  Abdominal: Soft. BS +, no distension, tenderness, rebound or guarding.  Musculoskeletal: Abnormal gait favoring right-sided deviation. No joint swelling or contractures. Normal range of motion. No edema and no tenderness.  Lymphadenopathy: No lymphadenopathy noted, cervical, inguinal or axillary Neuro: Alert. Normal reflexes, muscle tone coordination. No cranial nerve deficit. Skin: Skin is warm and dry. No rash noted. Not diaphoretic. No erythema. No pallor. Psychiatric: Normal mood and affect. Behavior, judgment, thought content normal.  No results found for this basename: WBC, HGB, HCT, MCV, PLT   No results found for this basename: CREATININE, BUN, NA, K, CL, CO2    No results found for this basename: HGBA1C   Lipid Panel  No results found for this basename: chol, trig, hdl, cholhdl, vldl, ldlcalc       Assessment and plan:   Medical records brought to the clinic by patient's were reviewed by me All recent labs were reviewed, results noted, discussed with patient  1. Generalized dystonia Refill - rizatriptan (MAXALT) 10 MG tablet; Take 1 tablet (10 mg total) by mouth  as needed for migraine. May repeat in 2 hours if needed  Dispense: 10 tablet; Refill: 1 - ondansetron (ZOFRAN) 4 MG tablet; Take 1 tablet (4 mg total) by mouth every 8 (eight) hours as needed for nausea or vomiting.  Dispense: 30 tablet; Refill: 3 - Levonorgestrel-Ethinyl Estradiol (AMETHIA,CAMRESE) 0.15-0.03 &0.01 MG tablet; Take 1 tablet by mouth daily.  Dispense: 1 Package; Refill: 4  - Ambulatory referral to Neurosurgery  Patient was extensively counseled on smoking cessation  Return in about 3 months (around 02/27/2014), or if symptoms worsen or fail to improve, for Routine Follow Up.  The patient was given clear instructions to go to ER or return to medical center if symptoms don't improve, worsen or new problems develop. The patient verbalized understanding. The patient was told to call to get lab results if they haven't heard anything in the next week.     [name], MD, [place], [place], [place], [place]   11/30/2013, 11:08 AM

## 2013-11-30 NOTE — Patient Instructions (Signed)
Dystonias  The dystonias are movement disorders in which sustained muscle contractions cause twisting and repetitive movements or abnormal postures. The movements, which are involuntary and sometimes painful, may affect a single muscle; a group of muscles such as those in the arms, legs, or neck; or the entire body. Early symptoms (problems) may include a deterioration in handwriting after writing several lines, foot cramps, and a tendency of one foot to pull up or drag after running or walking some distance. Other possible symptoms are tremor and voice or speech difficulties. Birth injury (particularly due to lack of oxygen), certain infections, reactions to certain drugs, heavy-metal or carbon monoxide poisoning, trauma (damage caused by an accident), or stroke can cause dystonic symptoms. About half the cases of dystonia have no connection to disease or injury and are called primary or idiopathic dystonia. Of the primary dystonias, many cases appear to be inherited in a dominant manner. Dystonias can also be symptoms of other diseases, some of which may be hereditary (passed down from parents). In some individuals, symptoms of a dystonia appear spontaneously in childhood between the ages of 5 and 16, usually in the foot or in the hand. For other individuals, the symptoms emerge in late adolescence or early adulthood.  TREATMENT   No one treatment has been found universally effective for dystonia. Instead, physicians use a variety of therapies (medications, surgery and other treatments such as physical therapy, splinting, stress management, and biofeedback), aimed at reducing or eliminating muscle spasms and pain. Since response to drugs varies among patients and even in the same person over time, the therapy must be individualized.  PROGNOSIS  The initial symptoms can be very mild and may be noticeable only after prolonged exertion, stress, or fatigue. Over a period of time, the symptoms may become more  noticeable and widespread and be unrelenting; sometimes, however, there is little or no progression.  RESEARCH BEING DONE  Investigators believe that the dystonias result from an abnormality in an area of the brain called the basal ganglia, where some of the messages that initiate muscle contractions are processed. Scientists suspect a defect in the body's ability to process a group of chemicals called neurotransmitters that help cells in the brain communicate with each other. Scientists at the [facility] laboratories have conducted detailed investigations of the pattern of muscle activity in persons with dystonias. Studies using EEG analysis and neuroimaging are probing brain activity. The search for the gene or genes responsible for some forms of dominantly inherited dystonias continues. 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 In 1997, the team sequenced the DYT1 gene and found that it codes for a previously unknown protein now called "torsin A."  Document Released: 09/14/2002 Document Revised: 12/17/2011 Document Reviewed: 09/24/2005  [employer]® Patient Information ©2014 [employer].
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
## 2013-12-02 DIAGNOSIS — G243 Spasmodic torticollis: Secondary | ICD-10-CM | POA: Diagnosis not present

## 2013-12-02 DIAGNOSIS — G248 Other dystonia: Secondary | ICD-10-CM | POA: Diagnosis not present

## 2013-12-10 ENCOUNTER — Other Ambulatory Visit: Payer: Self-pay | Admitting: Emergency Medicine

## 2013-12-10 ENCOUNTER — Telehealth: Payer: Self-pay | Admitting: Emergency Medicine

## 2013-12-10 DIAGNOSIS — G249 Dystonia, unspecified: Secondary | ICD-10-CM

## 2013-12-10 MED ORDER — LEVONORGEST-ETH ESTRAD 91-DAY 0.15-0.03 &0.01 MG PO TABS: 1.0000 | ORAL_TABLET | Freq: Every day | ORAL | Status: DC

## 2013-12-10 NOTE — Telephone Encounter (Signed)
[facility] sent refill request for [facility]. Insurance only covers 30 days. Updated ordered

## 2013-12-28 DIAGNOSIS — M5030 Other cervical disc degeneration, unspecified cervical region: Secondary | ICD-10-CM | POA: Diagnosis not present

## 2013-12-28 DIAGNOSIS — M25519 Pain in unspecified shoulder: Secondary | ICD-10-CM | POA: Diagnosis not present

## 2013-12-28 DIAGNOSIS — M542 Cervicalgia: Secondary | ICD-10-CM | POA: Diagnosis not present

## 2014-02-02 DIAGNOSIS — M5030 Other cervical disc degeneration, unspecified cervical region: Secondary | ICD-10-CM | POA: Diagnosis not present

## 2014-02-02 DIAGNOSIS — M25519 Pain in unspecified shoulder: Secondary | ICD-10-CM | POA: Diagnosis not present

## 2014-02-02 DIAGNOSIS — G248 Other dystonia: Secondary | ICD-10-CM | POA: Diagnosis not present

## 2014-03-04 ENCOUNTER — Ambulatory Visit: Payer: Medicare Other | Admitting: Internal Medicine

## 2014-03-04 DIAGNOSIS — H5210 Myopia, unspecified eye: Secondary | ICD-10-CM | POA: Diagnosis not present

## 2014-03-04 DIAGNOSIS — H524 Presbyopia: Secondary | ICD-10-CM | POA: Diagnosis not present

## 2014-03-04 DIAGNOSIS — H52229 Regular astigmatism, unspecified eye: Secondary | ICD-10-CM | POA: Diagnosis not present

## 2014-03-04 DIAGNOSIS — H43399 Other vitreous opacities, unspecified eye: Secondary | ICD-10-CM | POA: Diagnosis not present

## 2014-03-05 ENCOUNTER — Other Ambulatory Visit (HOSPITAL_COMMUNITY): Payer: Self-pay | Admitting: Neurology

## 2014-03-05 DIAGNOSIS — M5440 Lumbago with sciatica, unspecified side: Secondary | ICD-10-CM

## 2014-03-05 DIAGNOSIS — G243 Spasmodic torticollis: Secondary | ICD-10-CM | POA: Diagnosis not present

## 2014-03-05 DIAGNOSIS — G248 Other dystonia: Secondary | ICD-10-CM | POA: Diagnosis not present

## 2014-03-08 ENCOUNTER — Other Ambulatory Visit (HOSPITAL_COMMUNITY): Payer: Self-pay | Admitting: Neurology

## 2014-03-08 ENCOUNTER — Other Ambulatory Visit: Payer: Self-pay

## 2014-03-08 DIAGNOSIS — M79609 Pain in unspecified limb: Secondary | ICD-10-CM

## 2014-03-08 DIAGNOSIS — M25519 Pain in unspecified shoulder: Secondary | ICD-10-CM

## 2014-03-08 DIAGNOSIS — R52 Pain, unspecified: Secondary | ICD-10-CM

## 2014-03-15 ENCOUNTER — Other Ambulatory Visit: Payer: Self-pay | Admitting: Internal Medicine

## 2014-03-19 ENCOUNTER — Ambulatory Visit (HOSPITAL_COMMUNITY)
Admission: RE | Admit: 2014-03-19 | Discharge: 2014-03-19 | Disposition: A | Discharge status: Home / Self Care | Payer: Medicare Other | Source: Ambulatory Visit | Attending: Neurology | Admitting: Neurology

## 2014-03-19 ENCOUNTER — Ambulatory Visit (HOSPITAL_COMMUNITY)
Admission: RE | Admit: 2014-03-19 | Discharge: 2014-03-19 | Disposition: A | Discharge status: Home / Self Care | Payer: Medicare Other | Source: Ambulatory Visit | Attending: *Deleted | Admitting: *Deleted

## 2014-03-19 DIAGNOSIS — M5137 Other intervertebral disc degeneration, lumbosacral region: Secondary | ICD-10-CM | POA: Diagnosis not present

## 2014-03-19 DIAGNOSIS — M25519 Pain in unspecified shoulder: Secondary | ICD-10-CM | POA: Insufficient documentation

## 2014-03-19 DIAGNOSIS — M545 Low back pain, unspecified: Secondary | ICD-10-CM | POA: Insufficient documentation

## 2014-03-19 DIAGNOSIS — M47817 Spondylosis without myelopathy or radiculopathy, lumbosacral region: Secondary | ICD-10-CM | POA: Diagnosis not present

## 2014-03-19 DIAGNOSIS — M51379 Other intervertebral disc degeneration, lumbosacral region without mention of lumbar back pain or lower extremity pain: Secondary | ICD-10-CM | POA: Diagnosis not present

## 2014-03-19 DIAGNOSIS — M5126 Other intervertebral disc displacement, lumbar region: Secondary | ICD-10-CM | POA: Diagnosis not present

## 2014-03-19 DIAGNOSIS — M79609 Pain in unspecified limb: Secondary | ICD-10-CM | POA: Insufficient documentation

## 2014-03-19 DIAGNOSIS — M5440 Lumbago with sciatica, unspecified side: Secondary | ICD-10-CM

## 2014-03-19 MED ORDER — GADOBENATE DIMEGLUMINE 529 MG/ML IV SOLN
10.0000 mL | Freq: Once | INTRAVENOUS | Status: AC | PRN
  Administered 2014-03-19: 10 mL via INTRAVENOUS

## 2014-03-23 DIAGNOSIS — M25519 Pain in unspecified shoulder: Secondary | ICD-10-CM | POA: Diagnosis not present

## 2014-03-23 DIAGNOSIS — M5030 Other cervical disc degeneration, unspecified cervical region: Secondary | ICD-10-CM | POA: Diagnosis not present

## 2014-03-23 DIAGNOSIS — G248 Other dystonia: Secondary | ICD-10-CM | POA: Diagnosis not present

## 2014-03-25 ENCOUNTER — Other Ambulatory Visit (HOSPITAL_COMMUNITY)
Admission: RE | Admit: 2014-03-25 | Discharge: 2014-03-25 | Disposition: A | Discharge status: Home / Self Care | Payer: Medicare Other | Source: Ambulatory Visit | Attending: Internal Medicine | Admitting: Internal Medicine

## 2014-03-25 ENCOUNTER — Ambulatory Visit: Payer: Medicare Other | Attending: Internal Medicine | Admitting: Internal Medicine

## 2014-03-25 ENCOUNTER — Encounter: Payer: Self-pay | Admitting: Internal Medicine

## 2014-03-25 VITALS — BP 121/59 | HR 88 | Temp 98.5°F | Resp 14 | Ht 64.0 in | Wt 110.0 lb

## 2014-03-25 DIAGNOSIS — E063 Autoimmune thyroiditis: Secondary | ICD-10-CM | POA: Diagnosis not present

## 2014-03-25 DIAGNOSIS — Z124 Encounter for screening for malignant neoplasm of cervix: Secondary | ICD-10-CM | POA: Insufficient documentation

## 2014-03-25 DIAGNOSIS — G248 Other dystonia: Secondary | ICD-10-CM

## 2014-03-25 DIAGNOSIS — N760 Acute vaginitis: Secondary | ICD-10-CM | POA: Insufficient documentation

## 2014-03-25 DIAGNOSIS — G241 Genetic torsion dystonia: Secondary | ICD-10-CM | POA: Insufficient documentation

## 2014-03-25 DIAGNOSIS — Z113 Encounter for screening for infections with a predominantly sexual mode of transmission: Secondary | ICD-10-CM | POA: Diagnosis not present

## 2014-03-25 DIAGNOSIS — R259 Unspecified abnormal involuntary movements: Secondary | ICD-10-CM | POA: Diagnosis not present

## 2014-03-25 DIAGNOSIS — G43909 Migraine, unspecified, not intractable, without status migrainosus: Secondary | ICD-10-CM | POA: Diagnosis not present

## 2014-03-25 DIAGNOSIS — Z1151 Encounter for screening for human papillomavirus (HPV): Secondary | ICD-10-CM | POA: Insufficient documentation

## 2014-03-25 DIAGNOSIS — R87619 Unspecified abnormal cytological findings in specimens from cervix uteri: Secondary | ICD-10-CM | POA: Insufficient documentation

## 2014-03-25 DIAGNOSIS — G249 Dystonia, unspecified: Secondary | ICD-10-CM

## 2014-03-25 NOTE — Progress Notes (Signed)
Pt is here following up on her Dystonias. Pt has been getting injections for her neurologist. Pt is currently attending pain management.

## 2014-03-26 ENCOUNTER — Ambulatory Visit: Payer: Medicare Other | Attending: Internal Medicine

## 2014-03-26 DIAGNOSIS — E063 Autoimmune thyroiditis: Secondary | ICD-10-CM | POA: Diagnosis not present

## 2014-03-26 DIAGNOSIS — Z124 Encounter for screening for malignant neoplasm of cervix: Secondary | ICD-10-CM | POA: Diagnosis not present

## 2014-03-26 LAB — CBC WITH DIFFERENTIAL/PLATELET
BASOS ABS: 0 10*3/uL (ref 0.0–0.1)
Basophils Relative: 0 % (ref 0–1)
EOS PCT: 1 % (ref 0–5)
Eosinophils Absolute: 0.1 10*3/uL (ref 0.0–0.7)
HEMATOCRIT: 40 % (ref 36.0–46.0)
HEMOGLOBIN: 13.7 g/dL (ref 12.0–15.0)
LYMPHS ABS: 1.9 10*3/uL (ref 0.7–4.0)
LYMPHS PCT: 36 % (ref 12–46)
MCH: 32.5 pg (ref 26.0–34.0)
MCHC: 34.3 g/dL (ref 30.0–36.0)
MCV: 94.8 fL (ref 78.0–100.0)
MONO ABS: 0.6 10*3/uL (ref 0.1–1.0)
Monocytes Relative: 11 % (ref 3–12)
NEUTROS ABS: 2.8 10*3/uL (ref 1.7–7.7)
Neutrophils Relative %: 52 % (ref 43–77)
Platelets: 203 10*3/uL (ref 150–400)
RBC: 4.22 MIL/uL (ref 3.87–5.11)
RDW: 13.7 % (ref 11.5–15.5)
WBC: 5.3 10*3/uL (ref 4.0–10.5)

## 2014-03-27 LAB — COMPLETE METABOLIC PANEL WITH GFR
ALT: 18 U/L (ref 0–35)
AST: 18 U/L (ref 0–37)
Albumin: 4.2 g/dL (ref 3.5–5.2)
Alkaline Phosphatase: 27 U/L — ABNORMAL LOW (ref 39–117)
BUN: 6 mg/dL (ref 6–23)
CALCIUM: 9 mg/dL (ref 8.4–10.5)
CHLORIDE: 105 meq/L (ref 96–112)
CO2: 22 meq/L (ref 19–32)
Creat: 0.7 mg/dL (ref 0.50–1.10)
GFR, Est Non African American: >89 mL/min
Glucose, Bld: 79 mg/dL (ref 70–99)
POTASSIUM: 4 meq/L (ref 3.5–5.3)
Sodium: 138 mEq/L (ref 135–145)
Total Bilirubin: 0.5 mg/dL (ref 0.2–1.2)
Total Protein: 6.6 g/dL (ref 6.0–8.3)

## 2014-03-27 LAB — URINALYSIS, COMPLETE
CASTS: NONE SEEN
GLUCOSE, UA: NEGATIVE mg/dL
HGB URINE DIPSTICK: NEGATIVE
NITRITE: NEGATIVE
Protein, ur: NEGATIVE mg/dL
Specific Gravity, Urine: 1.021 (ref 1.005–1.030)
Urobilinogen, UA: 1 mg/dL (ref 0.0–1.0)
pH: 5 (ref 5.0–8.0)

## 2014-03-27 LAB — LIPID PANEL
CHOL/HDL RATIO: 4.3 ratio
Cholesterol: 242 mg/dL — ABNORMAL HIGH (ref 0–200)
HDL: 56 mg/dL (ref >39)
LDL Cholesterol: 153 mg/dL — ABNORMAL HIGH (ref 0–99)
Triglycerides: 167 mg/dL — ABNORMAL HIGH (ref <150)
VLDL: 33 mg/dL (ref 0–40)

## 2014-03-27 LAB — TSH: TSH: 12.401 u[IU]/mL — AB (ref 0.350–4.500)

## 2014-03-29 ENCOUNTER — Telehealth: Payer: Self-pay | Admitting: Emergency Medicine

## 2014-03-29 LAB — THYROID ANTIBODIES
THYROID PEROXIDASE ANTIBODY: 617 [IU]/mL — AB (ref <35.0)
Thyroglobulin Ab: <20 IU/mL (ref <40.0)

## 2014-03-29 LAB — CYTOLOGY - PAP

## 2014-03-29 MED ORDER — FLUCONAZOLE 150 MG PO TABS: 150.0000 mg | ORAL_TABLET | Freq: Once | ORAL | Status: DC

## 2014-03-29 MED ORDER — METRONIDAZOLE 500 MG PO TABS: 2000.0000 mg | ORAL_TABLET | Freq: Once | ORAL | Status: DC

## 2014-03-29 NOTE — Telephone Encounter (Signed)
Message copied by Ricci Barker on Mon Mar 29, 2014  3:08 PM ------      Message from: Tresa Garter      Created: Sun Mar 28, 2014  4:28 PM       Please inform patient that her Pap smear shows infection with bacterial vaginosis, this is not a sexually transmitted disease and can easily be treated with antibiotics. We're still waiting for the Pap smear cytology      Please call in prescription metronidazole 500 mg tablet, take 4 tablets by mouth once. (Total of 2 g) ------

## 2014-03-29 NOTE — Telephone Encounter (Signed)
Pt given pap smear results with medication instructions to take Flagyl 500 mg,take 4 tablets at once by mouth. Pt also requesting medication for yeast side effect . We will prescribe Diflucan 150 mg tab Pt informed she will be notified with pending cytology report

## 2014-04-01 LAB — THYROID STIMULATING IMMUNOGLOBULIN: TSI: 42 % baseline (ref <140)

## 2014-04-19 DIAGNOSIS — G245 Blepharospasm: Secondary | ICD-10-CM | POA: Diagnosis not present

## 2014-04-20 DIAGNOSIS — G243 Spasmodic torticollis: Secondary | ICD-10-CM | POA: Diagnosis not present

## 2014-04-20 DIAGNOSIS — Z5181 Encounter for therapeutic drug level monitoring: Secondary | ICD-10-CM | POA: Diagnosis not present

## 2014-04-20 DIAGNOSIS — M545 Low back pain, unspecified: Secondary | ICD-10-CM | POA: Diagnosis not present

## 2014-04-20 DIAGNOSIS — G248 Other dystonia: Secondary | ICD-10-CM | POA: Diagnosis not present

## 2014-04-20 DIAGNOSIS — Z79899 Other long term (current) drug therapy: Secondary | ICD-10-CM | POA: Diagnosis not present

## 2014-04-20 DIAGNOSIS — M25519 Pain in unspecified shoulder: Secondary | ICD-10-CM | POA: Diagnosis not present

## 2014-04-20 DIAGNOSIS — M79609 Pain in unspecified limb: Secondary | ICD-10-CM | POA: Diagnosis not present

## 2014-04-20 DIAGNOSIS — M5030 Other cervical disc degeneration, unspecified cervical region: Secondary | ICD-10-CM | POA: Diagnosis not present

## 2014-04-26 ENCOUNTER — Telehealth: Payer: Self-pay | Admitting: Emergency Medicine

## 2014-04-26 DIAGNOSIS — Z8639 Personal history of other endocrine, nutritional and metabolic disease: Secondary | ICD-10-CM

## 2014-04-26 MED ORDER — SIMVASTATIN 20 MG PO TABS: 20.0000 mg | ORAL_TABLET | Freq: Every day | ORAL | Status: DC

## 2014-04-26 NOTE — Telephone Encounter (Signed)
Left message for pt to call for lab results Medication simvastatin 20 mg tab ordered and e-scribed to preferred pharmacy Referral for Endocrinology placed

## 2014-04-26 NOTE — Telephone Encounter (Signed)
Message copied by Ricci Barker on Mon Apr 26, 2014  5:33 PM ------      Message from: [name] E      Created: Thu Apr 22, 2014  9:18 AM       Please inform patient that her laboratory tests results shows normal kidney and liver function, her cholesterol level is very high and her thyroid function is low. We will start her on cholesterol medicine and refer her to endocrinologist for further tests and treatment of her thyroid.            Please call in prescription simvastatin 20 mg tablet by mouth daily, 30 tablets with 3 refills      Please place a referral to endocrinologist for possible Hashimoto's thyroiditis ------

## 2014-04-27 ENCOUNTER — Other Ambulatory Visit: Payer: Self-pay | Admitting: Emergency Medicine

## 2014-04-27 MED ORDER — SIMVASTATIN 20 MG PO TABS: 20.0000 mg | ORAL_TABLET | Freq: Every day | ORAL | Status: DC

## 2014-04-27 NOTE — Telephone Encounter (Signed)
Pt given lab results with medication instructions and management Endocrinology referral placed  Medication Simvastatin 20 mg tablet [facility]

## 2014-04-30 ENCOUNTER — Other Ambulatory Visit: Payer: Self-pay | Admitting: Emergency Medicine

## 2014-04-30 DIAGNOSIS — J029 Acute pharyngitis, unspecified: Secondary | ICD-10-CM

## 2014-05-14 ENCOUNTER — Ambulatory Visit (INDEPENDENT_AMBULATORY_CARE_PROVIDER_SITE_OTHER): Payer: Medicare Other | Admitting: Internal Medicine

## 2014-05-14 ENCOUNTER — Encounter: Payer: Self-pay | Admitting: Internal Medicine

## 2014-05-14 VITALS — BP 110/64 | HR 96 | Temp 98.4°F | Resp 12 | Ht 64.0 in | Wt 110.0 lb

## 2014-05-14 DIAGNOSIS — E063 Autoimmune thyroiditis: Secondary | ICD-10-CM | POA: Diagnosis not present

## 2014-05-14 NOTE — Patient Instructions (Addendum)
Please come for labs in am on Monday. Make sure you are off the Dopamine agonist or 12 hours before the labs. Please come back for a follow-up appointment in 3 months.  Hypothyroidism The thyroid is a large gland located in the lower front of your neck. The thyroid gland helps control metabolism. Metabolism is how your body handles food. It controls metabolism with the hormone thyroxine. When this gland is underactive (hypothyroid), it produces too little hormone.  CAUSES These include:   Absence or destruction of thyroid tissue.  Goiter due to iodine deficiency.  Goiter due to medications.  Congenital defects (since birth).  Problems with the pituitary. This causes a lack of TSH (thyroid stimulating hormone). This hormone tells the thyroid to turn out more hormone. SYMPTOMS  Lethargy (feeling as though you have no energy)  Cold intolerance  Weight gain (in spite of normal food intake)  Dry skin  Coarse hair  Menstrual irregularity (if severe, may lead to infertility)  Slowing of thought processes Cardiac problems are also caused by insufficient amounts of thyroid hormone. Hypothyroidism in the newborn is cretinism, and is an extreme form. It is important that this form be treated adequately and immediately or it will lead rapidly to retarded physical and mental development. DIAGNOSIS  To prove hypothyroidism, your caregiver may do blood tests and ultrasound tests. Sometimes the signs are hidden. It may be necessary for your caregiver to watch this illness with blood tests either before or after diagnosis and treatment. TREATMENT  Low levels of thyroid hormone are increased by using synthetic thyroid hormone. This is a safe, effective treatment. It usually takes about four weeks to gain the full effects of the medication. After you have the full effect of the medication, it will generally take another four weeks for problems to leave. Your caregiver may start you on low doses. If  you have had heart problems the dose may be gradually increased. It is generally not an emergency to get rapidly to normal. HOME CARE INSTRUCTIONS   Take your medications as your caregiver suggests. Let your caregiver know of any medications you are taking or start taking. Your caregiver will help you with dosage schedules.  As your condition improves, your dosage needs may increase. It will be necessary to have continuing blood tests as suggested by your caregiver.  Report all suspected medication side effects to your caregiver. SEEK MEDICAL CARE IF: Seek medical care if you develop:  Sweating.  Tremulousness (tremors).  Anxiety.  Rapid weight loss.  Heat intolerance.  Emotional swings.  Diarrhea.  Weakness. SEEK IMMEDIATE MEDICAL CARE IF:  You develop chest pain, an irregular heart beat (palpitations), or a rapid heart beat. MAKE SURE YOU:   Understand these instructions.  Will watch your condition.  Will get help right away if you are not doing well or get worse. Document Released: 09/24/2005 Document Revised: 12/17/2011 Document Reviewed: 05/14/2008 [facility] Patient Information 2015 [place], [place]. This information is not intended to replace advice given to you by your health care provider. Make sure you discuss any questions you have with your health care provider.

## 2014-05-14 NOTE — Progress Notes (Signed)
Patient ID: Megan Hodges, female   DOB: 1971/03/13, 43 y.o.   MRN: [id number]   HPI  Megan Hodges is a 43 y.o.-year-old female, referred by her PCP, Dr. Doreene Hodges, for management of hypothyroidism.  Pt. has been found to have a low TSH in 06/2013. She tells me that she was not started on thyroid hh as a repeat TSH was normal (however, this was drawn close to her Stalevo dose). Another TSH drawn 3 mo ago returned high (12h after Stalevo), this time, and also, her TPO antibodies returned elevated, confirming Hashimoto's thyroiditis.  I reviewed pt's thyroid tests:  Lab Results  Component Value Date   TSH 12.401* 03/26/2014  TPO 617 ATA <20 TSI 42 (<140%)  Pt describes: - +++ mm aches and spasms - + fatigue - + poor sleep - + both weight gain - + both heat and cold intolerance - + constipation - + dry skin - + hair falling - + depression  Pt denies feeling nodules in neck, + hoarseness, + dysphagia/no odynophagia, SOB with lying down.  She has + FH of thyroid disorders in: mother, aunt with Graves. No FH of thyroid cancer.  No h/o radiation tx to head or neck. No recent use of iodine supplements.  I reviewed her chart and she also has a history of of general dystonia. She is on injections (similar to Botox) for this every 3 mo.She also has HL, anxiety/depression. She is on OCPs.   ROS: Constitutional: see HPI Eyes: + blurry vision, no xerophthalmia ENT: + sore throat, no nodules palpated in throat, + dysphagia occas./no odynophagia, no hoarseness Cardiovascular: no CP/SOB/palpitations/leg swelling Respiratory: no cough/SOB Gastrointestinal: no N/V/D/+ C Musculoskeletal: +++ muscle aches and spasms/+ joint aches Skin: no rashes, + hair loss, + itching Neurological: no tremors/numbness/tingling/dizziness Psychiatric: + both depression/anxiety  Past Medical History  Diagnosis Date  . Generalized dystonia    Past Surgical History  Procedure Laterality Date  . Cesarean section     . L5 s1    . Spine surgery      L5-S1   History   Social History  . Marital Status: single    Spouse Name: N/A    Number of Children: 1: 49 y/o   Occupational History  . disabled   Social History Main Topics  . Smoking status: former Smoker -- 1.00 packs/day - quit 01/2014    Types: E-cigarettes  . Smokeless tobacco: Never Used  . Alcohol Use: No  . Drug Use: No   Current Outpatient Prescriptions on File Prior to Visit  Medication Sig Dispense Refill  . baclofen (LIORESAL) 20 MG tablet Take 20 mg by mouth 4 (four) times daily.      Marland Kitchen CAMRESE 0.15-0.03 &0.01 MG tablet TAKE 1 TABLET BY MOUTH DAILY.  91 tablet  0  . carbidopa-levodopa-entacapone (STALEVO) 25-100-200 MG per tablet Take 1 tablet by mouth 4 (four) times daily.      . dantrolene (DANTRIUM) 50 MG capsule Take 50 mg by mouth 4 (four) times daily.      . diazepam (VALIUM) 10 MG tablet Take 10 mg by mouth every 6 (six) hours as needed for anxiety.      . fluconazole (DIFLUCAN) 150 MG tablet Take 1 tablet (150 mg total) by mouth once.  1 tablet  2  . HYDROmorphone (DILAUDID) 2 MG tablet Take by mouth every 4 (four) hours as needed for severe pain.      Marland Kitchen HYDROmorphone HCl (EXALGO) 12 MG T24A SR tablet Take  16 mg by mouth daily.      . Linaclotide (LINZESS PO) Take by mouth.      . metroNIDAZOLE (FLAGYL) 500 MG tablet Take 4 tablets (2,000 mg total) by mouth once.  4 tablet  0  . ondansetron (ZOFRAN) 4 MG tablet Take 1 tablet (4 mg total) by mouth every 8 (eight) hours as needed for nausea or vomiting.  30 tablet  3  . pramipexole (MIRAPEX) 0.125 MG tablet Take 0.125 mg by mouth 3 (three) times daily.      . rizatriptan (MAXALT) 10 MG tablet Take 1 tablet (10 mg total) by mouth as needed for migraine. May repeat in 2 hours if needed  10 tablet  1  . simvastatin (ZOCOR) 20 MG tablet Take 1 tablet (20 mg total) by mouth at bedtime.  90 tablet  3   No current facility-administered medications on file prior to visit.    Allergies  Allergen Reactions  . Epinephrine   . Lidocaine    FH: see HPI  PE: BP 110/64  Pulse 96  Temp(Src) 98.4 F (36.9 C) (Oral)  Resp 12  Ht 5\' 4"  (1.626 m)  Wt 110 lb (49.896 kg)  BMI 18.87 kg/m2  SpO2 98% Wt Readings from Last 3 Encounters:  05/14/14 110 lb (49.896 kg)  03/25/14 110 lb (49.896 kg)  11/30/13 118 lb (53.524 kg)   Constitutional: thin, in distress b/c shoulder/neck mm spasms and hot flushes - she appears very uncomfortable  Eyes: PERRLA, EOMI, no exophthalmos ENT: moist mucous membranes, no thyromegaly, no cervical lymphadenopathy Cardiovascular: RRR, No MRG Respiratory: CTA B Gastrointestinal: abdomen soft, NT, ND, BS+ Musculoskeletal: no deformities, strength not tested 2/2 distress Skin: moist, warm, no rashes Neurological: no tremor with outstretched hands  ASSESSMENT: 1. Hypothyroidism - Hashimoto's ds  PLAN:  1. Patient with new dx of hypothyroidism, not on levothyroxine therapy yet. He tells me that she is constantly in distress (and appears so) due to her muscle spasms, which are only relieved 2 weeks after she gets a botulinum injection. She is not due for one soon. She is also on narcotics po. She does have fatigue and constipation, but other wise she appears euthyroid. - She does not appear to have a goiter, thyroid nodules, or neck compression symptoms. She has neck spasms which cause occasional dysphagia. - will check thyroid tests in am: TSH, free T4, free T3 (12 h after last dose of Stalevo, as the dopamine agonist can suppress the TSH if taken just before the blood draw) - if we start LT4, we will start low dose and increase based on labs - we discussed about [facility], which would not be a good fit for her due to the large fluctuations in the T3 component blood concentrations - If these are abnormal, she will need to return in 6-8 weeks for repeat labs - If these are normal, I will see her back in 3 months and repeat the labs  then  Component     Latest Ref Rng 05/17/2014  TSH     0.35 - 4.50 uIU/mL 11.12 (H)  Free T4     0.60 - 1.60 ng/dL 0.66  T3, Free     2.3 - 4.2 pg/mL 3.1   Subclinical hypothyroidism >> start levothyroxine 25 mcg daily in am. Will advise to take Levothyroxine EVERY DAY, with water, >30 min before b'fast, separated by >4h from anti acid medication, calcium, iron, MVI. Will need repeat TFTs in 6 weeks.

## 2014-05-17 ENCOUNTER — Other Ambulatory Visit (INDEPENDENT_AMBULATORY_CARE_PROVIDER_SITE_OTHER): Payer: Medicare Other

## 2014-05-17 DIAGNOSIS — E063 Autoimmune thyroiditis: Secondary | ICD-10-CM | POA: Diagnosis not present

## 2014-05-17 LAB — T3, FREE: T3 FREE: 3.1 pg/mL (ref 2.3–4.2)

## 2014-05-17 LAB — T4, FREE: Free T4: 0.66 ng/dL (ref 0.60–1.60)

## 2014-05-17 LAB — TSH: TSH: 11.12 u[IU]/mL — ABNORMAL HIGH (ref 0.35–4.50)

## 2014-05-17 MED ORDER — LEVOTHYROXINE SODIUM 25 MCG PO TABS: 25.0000 ug | ORAL_TABLET | Freq: Every day | ORAL | Status: DC

## 2014-05-18 ENCOUNTER — Encounter: Payer: Self-pay | Admitting: *Deleted

## 2014-05-25 ENCOUNTER — Other Ambulatory Visit: Payer: Self-pay | Admitting: Internal Medicine

## 2014-06-11 DIAGNOSIS — G243 Spasmodic torticollis: Secondary | ICD-10-CM | POA: Diagnosis not present

## 2014-06-11 DIAGNOSIS — G248 Other dystonia: Secondary | ICD-10-CM | POA: Diagnosis not present

## 2014-06-16 ENCOUNTER — Telehealth: Payer: Self-pay | Admitting: Internal Medicine

## 2014-06-16 NOTE — Telephone Encounter (Signed)
Please read note below and advise.  

## 2014-06-16 NOTE — Telephone Encounter (Signed)
Patient states that she is extremely tired and having hot flashes Per patient she states Dr. Cruzita Hodges advised her to try the generic synthroid and then to try synthroid if generic not working  Please advise patient   Thank you

## 2014-06-17 NOTE — Telephone Encounter (Signed)
They are already in from last visit.

## 2014-06-17 NOTE — Telephone Encounter (Signed)
Let's have her come back for a repeat set of labs - it is time. We will see if she needs a change in dose. If she prefers, we can try Synthroid DAW, but let's check labs first.

## 2014-06-17 NOTE — Telephone Encounter (Signed)
Called pt and advised her per Dr Arman Filter note. Pt understood. Pt scheduled to have labs on Tues, Sept 15th. Do you want me to order labs? Please advise.

## 2014-06-22 ENCOUNTER — Other Ambulatory Visit (INDEPENDENT_AMBULATORY_CARE_PROVIDER_SITE_OTHER): Payer: Medicare Other

## 2014-06-22 ENCOUNTER — Telehealth: Payer: Self-pay | Admitting: Internal Medicine

## 2014-06-22 DIAGNOSIS — E063 Autoimmune thyroiditis: Secondary | ICD-10-CM

## 2014-06-22 LAB — T4, FREE: FREE T4: 0.84 ng/dL (ref 0.60–1.60)

## 2014-06-22 LAB — TSH: TSH: 4.6 u[IU]/mL — AB (ref 0.35–4.50)

## 2014-06-22 NOTE — Telephone Encounter (Signed)
[facility] nurse Ms. Hoag is calling to request a form to be filled out for this patient Megan Hodges. She will be faxing this form with attention to Dr. Doreene Burke. This form is requested by the pt to be able to start home care. After Doreene Burke completes the forms Megan Hodges is requesting a copy of this as confirmation at her fax number is [phone]. Then we need to fax this form to [facility] at either [phone] or [phone].

## 2014-06-28 ENCOUNTER — Telehealth: Payer: Self-pay | Admitting: Internal Medicine

## 2014-06-28 ENCOUNTER — Ambulatory Visit (INDEPENDENT_AMBULATORY_CARE_PROVIDER_SITE_OTHER): Payer: Medicare Other | Admitting: Neurology

## 2014-06-28 ENCOUNTER — Other Ambulatory Visit: Payer: Self-pay | Admitting: *Deleted

## 2014-06-28 ENCOUNTER — Encounter: Payer: Self-pay | Admitting: Neurology

## 2014-06-28 VITALS — BP 118/64 | HR 76 | Ht 64.0 in | Wt 114.0 lb

## 2014-06-28 DIAGNOSIS — G249 Dystonia, unspecified: Secondary | ICD-10-CM

## 2014-06-28 DIAGNOSIS — R259 Unspecified abnormal involuntary movements: Secondary | ICD-10-CM | POA: Diagnosis not present

## 2014-06-28 MED ORDER — LEVOTHYROXINE SODIUM 25 MCG PO TABS: 50.0000 ug | ORAL_TABLET | Freq: Every day | ORAL | Status: DC

## 2014-06-28 NOTE — Progress Notes (Signed)
Megan Hodges was seen today in neurologic consultation at the request of [name], MD.  She was previously seeing Dr. Rowe Pavy in [place]. The consultation is for the evaluation of dystonia.  I reviewed an extensive number of records from her prior neurologist.  I also got history from the patient.  The patient reports that the onset of her symptoms was acute in 2010.  She was walking at the mall at the onset of symptoms, and states that suddenly her arm began to twist.  Over the course of time, she states that she literally became wheelchair-bound.  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  She reports that, because of this, she now suffers from posttraumatic stress disorder.  She states that once she began to see Dr. Haynes Kerns and began to do her own research, she realized that she had dystonia.  She states that she was wheelchair-bound in a motorized wheelchair for several years before she asked to be placed on Stalevo.  She states that within 2 days of being placed on Stalevo, she was able to walk, though admits to having dyskinesia within a few days of being on the medication.  The dosage was lowered, and she is currently on Stalevo 50mg  four times per day.  She is also on baclofen 20 mg 4 times per day as well as dantrolene 50 mg 4 times per day and Mirapex 0.5 mg 3 times per day.  She has been receiving Botox over a fairly extensive area (cervical, thoracic, lumbar, pectoralis, rhomboid, trapezius, gastroc) for her dystonia.  Her last Botox injection report I have was from 03/05/2014.  She receives Botox in the form of Dysport (abobotulinum toxin type A).  She receives a fairly large dosage ( 1000 units).  She states that she had injections on 9/4 but I don't have that report.  The patient reports that she has tried onobotulinum  toxin type A but states that she was allergic to the medication and just could not tolerate it.  She states that even with the Dysport, she has fever, diarrhea for 3 days after the injections, but states that it was much worse with onobotulinum toxin. She just went up on 9/4 on the mirapex for diplopia and and wants to go up again.  She reports that when she drives long distances she will have blepharospasm that she relates to dystonia.  She relates that pressured speech is from dystonia and treatments for it (relates to tegretol).   ALLERGIES:   Allergies  Allergen Reactions  . Epinephrine   . Lidocaine     CURRENT MEDICATIONS:  Outpatient Encounter Prescriptions as of 06/28/2014  Medication Sig  . baclofen (LIORESAL) 20 MG tablet Take 20 mg by mouth 4 (four) times daily.  Marland Kitchen CAMRESE 0.15-0.03 &0.01 MG tablet TAKE 1 TABLET BY MOUTH DAILY.  . carbidopa-levodopa-entacapone (STALEVO) 12.5-50-200 MG per tablet Take 1 tablet by mouth 4 (four) times daily.  . dantrolene (DANTRIUM) 50 MG capsule Take 50 mg by mouth 4 (four) times daily.  . diazepam (VALIUM) 10 MG tablet Take 10 mg by mouth every 6 (six) hours as needed for anxiety.  Marland Kitchen HYDROmorphone (DILAUDID) 2 MG tablet Take by mouth daily.   Marland Kitchen HYDROmorphone HCl (EXALGO) 12 MG T24A SR tablet Take 16 mg by mouth daily.  . Linaclotide (LINZESS PO) Take by mouth.  Marland Kitchen  ondansetron (ZOFRAN) 4 MG tablet Take 1 tablet (4 mg total) by mouth every 8 (eight) hours as needed for nausea or vomiting.  . pramipexole (MIRAPEX) 0.5 MG tablet Take 0.5 mg by mouth 3 (three) times daily.  . Red Yeast Rice Extract (RED YEAST RICE PO) Take by mouth daily.  . rizatriptan (MAXALT) 10 MG tablet TAKE 1 TABLET (10 MG TOTAL) BY MOUTH AS NEEDED FOR MIGRAINE. MAY REPEAT IN 2 HOURS IF NEEDED  . [DISCONTINUED] carbidopa-levodopa-entacapone (STALEVO) 25-100-200 MG per tablet Take 1 tablet by mouth 4 (four) times daily.  . [DISCONTINUED] levothyroxine (SYNTHROID, LEVOTHROID) 25 MCG  tablet Take 1 tablet (25 mcg total) by mouth daily before breakfast.  . [DISCONTINUED] levothyroxine (SYNTHROID, LEVOTHROID) 25 MCG tablet Take 50 mcg by mouth daily before breakfast.  . [DISCONTINUED] fluconazole (DIFLUCAN) 150 MG tablet Take 1 tablet (150 mg total) by mouth once.  . [DISCONTINUED] metroNIDAZOLE (FLAGYL) 500 MG tablet Take 4 tablets (2,000 mg total) by mouth once.  . [DISCONTINUED] pramipexole (MIRAPEX) 0.125 MG tablet Take 0.125 mg by mouth 3 (three) times daily.  . [DISCONTINUED] simvastatin (ZOCOR) 20 MG tablet Take 1 tablet (20 mg total) by mouth at bedtime.    PAST MEDICAL HISTORY:   Past Medical History  Diagnosis Date  . Generalized dystonia   . Hypercholesteremia   . Hashimoto's disease   . Migraines     PAST SURGICAL HISTORY:   Past Surgical History  Procedure Laterality Date  . Cesarean section    . Spine surgery      L5-S1    SOCIAL HISTORY:   History   Social History  . Marital Status: Unknown    Spouse Name: N/A    Number of Children: N/A  . Years of Education: N/A   Occupational History  . Not on file.   Social History Main Topics  . Smoking status: Smoker, Current Status Unknown -- 1.00 packs/day    Types: E-cigarettes  . Smokeless tobacco: Never Used     Comment: no ciggarettes since April - now vaping   . Alcohol Use: No  . Drug Use: No  . Sexual Activity: Not Currently   Other Topics Concern  . Not on file   Social History Narrative  . No narrative on file    FAMILY HISTORY:   Family Status  Relation Status Death Age  . Father Alive     healthy  . Mother Alive     dystonina, thyroid disease  . Son Alive     unknown    ROS:  A complete 10 system review of systems was obtained and was unremarkable apart from what is mentioned above.  PHYSICAL EXAMINATION:    VITALS:   Filed Vitals:   06/28/14 1319  BP: 118/64  Pulse: 76  Height: 5\' 4"  (1.626 m)  Weight: 114 lb (51.71 kg)    GEN:  Normal appears female in no  acute distress.  Appears stated age.  Has difficulty staying focused and on topic. HEENT:  Normocephalic, atraumatic. The mucous membranes are moist. The superficial temporal arteries are without ropiness or tenderness. Cardiovascular: Regular rate and rhythm. Lungs: Clear to auscultation bilaterally. Neck/Heme: There are no carotid bruits noted bilaterally.  NEUROLOGICAL: Orientation:  The patient is alert and oriented x 3.  Fund of knowledge is appropriate.  Recent and remote memory intact.  Attention span and concentration normal.  Repeats and names without difficulty. Cranial nerves: There is good facial symmetry. The pupils are equal round and reactive  to light bilaterally. No blepharospasm noted today.  Speech is fluent and clear.  Speech is very pressured.   Soft palate rises symmetrically and there is no tongue deviation. Hearing is intact to conversational tone. Tone: Tone is good throughout. Sensation: Sensation is intact to light touch  Vibration is intact at the bilateral big toe. There is no extinction with double simultaneous stimulation. There is no sensory dermatomal level identified. Coordination:  The patient has no difficulty with RAM's or FNF bilaterally. Motor: Strength is 5/5 in the LUE and bilateral lower extremities.  Initially reports inability to supinate R arm but then does this spontaneously immediately afterward when doing another task.  Reports R arm/shoulder discomfort. Gait and Station: The patient ambulates with wide based gait.  Some astasia abasia quality and then will walk very normally. Movements:  Does have mild dyskinesia.    IMPRESSION/PLAN  1. ? Dystonia   -Pt just received 1000units of dysport 2 weeks ago.  No convincing evidence of dystonia today but unsure if due to dysport and multiple meds.  Does have some dyskinesia from stalevo.  I did explain to the patient that we don't do Dysport and she was quite specific in that she wants that type of botox.   She has been told by multiple other movement centers that this is conversion.  Would like to see if [facility] does dysport.  If not, she is going to drive back to [facility] for the injections per the patient.  I will find out if [facility] an option for opinion.    -Much greater than 50% of this 60 min visit in counseling with the patient and coordinating care.  Visit time was 60 min.

## 2014-06-28 NOTE — Telephone Encounter (Signed)
Pt states the increase in the generic synthroid is working well please call new dosage to the pharmacy please try to call in by tomorrow

## 2014-06-29 ENCOUNTER — Encounter (HOSPITAL_COMMUNITY): Payer: Self-pay | Admitting: Emergency Medicine

## 2014-06-29 ENCOUNTER — Telehealth: Payer: Self-pay | Admitting: Neurology

## 2014-06-29 ENCOUNTER — Emergency Department (INDEPENDENT_AMBULATORY_CARE_PROVIDER_SITE_OTHER)
Admission: EM | Admit: 2014-06-29 | Discharge: 2014-06-29 | Disposition: A | Discharge status: Home / Self Care | Payer: Medicare Other | Source: Home / Self Care | Attending: Family Medicine | Admitting: Family Medicine

## 2014-06-29 DIAGNOSIS — G249 Dystonia, unspecified: Secondary | ICD-10-CM

## 2014-06-29 DIAGNOSIS — M25519 Pain in unspecified shoulder: Secondary | ICD-10-CM | POA: Diagnosis not present

## 2014-06-29 DIAGNOSIS — R259 Unspecified abnormal involuntary movements: Secondary | ICD-10-CM

## 2014-06-29 DIAGNOSIS — M25511 Pain in right shoulder: Secondary | ICD-10-CM

## 2014-06-29 MED ORDER — PREDNISONE 5 MG PO KIT: PACK | ORAL | Status: DC

## 2014-06-29 NOTE — Discharge Instructions (Signed)
Thank you for coming in today. Come back as needed.    Adhesive Capsulitis Sometimes the shoulder becomes stiff and is painful to move. Some people say it feels as if the shoulder is frozen in place. Because of this, the condition is called "frozen shoulder." Its medical name is adhesive capsulitis.  The shoulder joint is made up of strong connective tissue that attaches the ball of the humerus to the shallow shoulder socket. This strong connective tissue is called the joint capsule. This tissue can become stiff and swollen. That is when adhesive capsulitis sets in. CAUSES  It is not always clear just what the cause adhesive capsulitis. Possibilities include:  Injury to the shoulder joint.  Strain. This is a repetitive injury brought about by overuse.  Lack of use. Perhaps your arm or hand was otherwise injured. It might have been in a sling for awhile. Or perhaps you were not using it to avoid pain.  Referred pain. This is a sort of trick the body plays. You feel pain in the shoulder. But, the pain actually comes from an injury somewhere else in the body.  Long-standing health problems. Several diseases can cause adhesive capsulitis. They include diabetes, heart disease, stroke, thyroid problems, rheumatoid arthritis and lung disease.  Being a women older than 46. Anyone can develop adhesive capsulitis but it is most common in women in this age group. SYMPTOMS   Pain.  It occurs when the arm is moved.  Parts of the shoulder might hurt if they are touched.  Pain is worse at night or when resting.  Soreness. It might not be strong enough to be called pain. But, the shoulder aches.  The shoulder does not move freely.  Muscle spasms.  Trouble sleeping because of shoulder ache or pain. DIAGNOSIS  To decide if you have adhesive capsulitis, your healthcare provider will probably:  Ask about symptoms you have noticed.  Ask about your history of joint pain and anything that might  have caused the pain.  Ask about your overall health.  Use hands to feel your shoulder and neck.  Ask you to move your shoulder in specific directions. This may indicate the origin of the pain.  Order imaging tests; pictures of the shoulder. They help pinpoint the source of the problem. An X-ray might be used. For more detail, an MRI is often used. An MRI details the tendons, muscles and ligaments as well as the joint. TREATMENT  Adhesive capsulitis can be treated several ways. Most treatments can be done in a clinic or in your healthcare provider's office. Be sure to discuss the different options with your caregiver. They include:  Physical therapy. You will work on specific exercises to get your shoulder moving again. The exercises usually involve stretching. A physical therapist (a caregiver with special training) can show you what to do and what not to do. The exercises will need to be done daily.  Medication.  Over-the-counter medicines may relieve pain and inflammation (the body's way of reacting to injury or infection).  Corticosteroids. These are stronger drugs to reduce pain and inflammation. They are given by injection (shots) into the shoulder joint. Frequent treatment is not recommended.  Muscle relaxants. Medication may be prescribed to ease muscle spasms.  Treatment of underlying conditions. This means treating another condition that is causing your shoulder problem. This might be a rotator cuff (tendon) problem  Shoulder manipulation. The shoulder will be moved by your healthcare provider. You would be under general anesthesia (given  a drug that puts you to sleep). You would not feel anything. Sometimes the joint will be injected with salt water (saline) at high pressure to break down internal scarring in the joint capsule.  Surgery. This is rarely needed. It may be suggested in advanced cases after all other treatment has failed. PROGNOSIS  In time, most people recover  from adhesive capsulitis. Sometimes, however, the pain goes away but full movement of the shoulder does not return.  HOME CARE INSTRUCTIONS   Take any pain medications recommended by your healthcare provider. Follow the directions carefully.  If you have physical therapy, follow through with the therapist's suggestions. Be sure you understand the exercises you will be doing. You should understand:  How often the exercises should be done.  How many times each exercise should be repeated.  How long they should be done.  What other activities you should do, or not do.  That you should warm up before doing any exercise. Just 5 to 10 minutes will help. Small, gentle movements should get your shoulder ready for more.  Avoid high-demand exercise that involves your shoulder such as throwing. This type of exercise can make pain worse.  Consider using cold packs. Cold may ease swelling and pain. Ask your healthcare provider if a cold pack might help you. If so, get directions on how and when to use them. SEEK MEDICAL CARE IF:   You have any questions about your medications.  Your pain continues to increase. Document Released: 07/22/2009 Document Revised: 12/17/2011 Document Reviewed: 07/22/2009 [facility] Patient Information 2015 [place], [place]. This information is not intended to replace advice given to you by your health care provider. Make sure you discuss any questions you have with your health care provider.

## 2014-06-29 NOTE — Telephone Encounter (Signed)
Jade, please let pt know that I talked to the movement center at [facility].  This is the email I got back from them:  Dysport is a problem  as the contract with [facility] does not exist.   I had never injected Dyport at [facility] because of the contract issues.   NB  Unfortunately, she is going to have the same problem at [facility] as here.  I personally don't know of anyone else that she hasn't been to already that she can go to that is closer.  In fact, I think that [facility], [facility], [facility], [facility] and [facility] (danielle englert) are perhaps the only movement d/o specialists in the state?  I think that her only option is to go back in [place] where she was previously receiving the dysport.  Sorry!

## 2014-06-29 NOTE — ED Notes (Signed)
Patient c/o right shoulder pain. Patient feels its her rotator cuff or collar bone. She reports she is on a lot of medication and pain medication she is taking is not helping. Patient is alert and oriented and in NAD.

## 2014-06-29 NOTE — Telephone Encounter (Signed)
06/28/14-If pt calls for a follow up appt with Dr. Carles Collet, please get approval from Dr. Carles Collet prior to scheduling / Gayleen [place] - [facility]

## 2014-06-29 NOTE — ED Provider Notes (Signed)
Megan Hodges is a 43 y.o. female who presents to Urgent Care today for right shoulder pain. Patient has a complicated medical history. 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 This is dopamine responsive and she is taking medications for this as arranged for by her neurologist in [facility]. She additionally takes high doses of opiates daily administered by her pain doctor also in [place].  She's had a typically worsening shoulder pain over the last week without any injury. She is concerned that she may have torn rotator cuff. She denies any radiating pain weakness or numbness. The pain is located in the anterior aspect of her shoulder and worse with any shoulder motion.   Past Medical History  Diagnosis Date  . Generalized dystonia   . Hypercholesteremia   . Hashimoto's disease   . Migraines    History  Substance Use Topics  . Smoking status: Smoker, Current Status Unknown -- 1.00 packs/day    Types: E-cigarettes  . Smokeless tobacco: Never Used     Comment: no ciggarettes since April - now vaping   . Alcohol Use: No   ROS as above Medications: No current facility-administered medications for this encounter.   Current Outpatient Prescriptions  Medication Sig Dispense Refill  . baclofen (LIORESAL) 20 MG tablet Take 20 mg by mouth 4 (four) times daily.      Marland Kitchen CAMRESE 0.15-0.03 &0.01 MG tablet TAKE 1 TABLET BY MOUTH DAILY.  91 tablet  0  . carbidopa-levodopa-entacapone (STALEVO) 12.5-50-200 MG per tablet Take 1 tablet by mouth 4 (four) times daily.      . dantrolene (DANTRIUM) 50 MG capsule Take 50 mg by mouth 4 (four) times daily.      . diazepam (VALIUM) 10 MG tablet Take 10 mg by mouth every 6 (six) hours as needed for anxiety.      Marland Kitchen HYDROmorphone (DILAUDID) 2 MG tablet Take by mouth daily.       Marland Kitchen HYDROmorphone HCl (EXALGO) 12 MG T24A SR tablet Take 16 mg by mouth daily.      Marland Kitchen levothyroxine (SYNTHROID, LEVOTHROID) 25  MCG tablet Take 2 tablets (50 mcg total) by mouth daily before breakfast.  60 tablet  2  . Linaclotide (LINZESS PO) Take by mouth.      . ondansetron (ZOFRAN) 4 MG tablet Take 1 tablet (4 mg total) by mouth every 8 (eight) hours as needed for nausea or vomiting.  30 tablet  3  . pramipexole (MIRAPEX) 0.5 MG tablet Take 0.5 mg by mouth 3 (three) times daily.      . PredniSONE 5 MG KIT 12 day dose pack po  1 kit  0  . Red Yeast Rice Extract (RED YEAST RICE PO) Take by mouth daily.      . rizatriptan (MAXALT) 10 MG tablet TAKE 1 TABLET (10 MG TOTAL) BY MOUTH AS NEEDED FOR MIGRAINE. MAY REPEAT IN 2 HOURS IF NEEDED  10 tablet  1    Exam:  BP 125/68  Pulse 104  Temp(Src) 99.2 F (37.3 C) (Oral)  Resp 16  SpO2 97% Gen: Well NAD thin HEENT: EOMI,  MMM Lungs: Normal work of breathing. CTABL Heart: RRR no MRG Abd: NABS, Soft. Nondistended, Nontender Exts: Brisk capillary refill, warm and well perfused.  Right shoulder: Atrophy of the muscle bellies of the rotator cuff Range of motion Limited external to about 40 beyond neutral position. Abduction is limited to about 70. Unable to perform impingement testing. Strength  is diminished secondary to pain.  Distal grip strength capillary refill and pulses are intact bilateral  Limited musculoskeletal ultrasound of the right shoulder: Biceps tendon is intact and normal appearing in the bicipital groove. Subscapularis is intact and normal appearing. Supraspinatus is intact and normal appearing Infraspinatus tendon is normal-appearing and intact A.c. joint is normal-appearing.  No results found for this or any previous visit (from the past 24 hour(s)). No results found.  Assessment and Plan: 43 y.o. female with shoulder pain. Unclear etiology. I believe this to be more likely to be related to her dystonia than anything else. Discussed options. Plan for trial of prednisone Dosepak and followup with her chronic doctors. Consider local sports  medicine as needed. I provided her with Dr. Thompson Caul contact information. She understands that she will not receive chronic pain medications from Dr. Tamala Julian.  Discussed warning signs or symptoms. Please see discharge instructions. Patient expresses understanding.     Gregor Hams, MD 06/29/14 2043

## 2014-06-29 NOTE — Telephone Encounter (Signed)
Patient made aware.

## 2014-07-01 ENCOUNTER — Other Ambulatory Visit: Payer: Medicare Other

## 2014-07-07 ENCOUNTER — Telehealth: Payer: Self-pay | Admitting: Emergency Medicine

## 2014-07-07 NOTE — Telephone Encounter (Signed)
Ms. Megan Hodges, [place] nurse calling to check on the status of the documents that were faxed over to office on 06/22/14. Megan Hodges informed that the physician has up to 14 days to have forms completed and sent back. Megan Hodges would just like an update if possible, so that she knows what to tell patient when she inquires. Please assist. (refer to previous not dated 06/22/14 for more information). Thank you.

## 2014-07-07 NOTE — Telephone Encounter (Signed)
Left a message with [facility] connection staff to call when message received. Message routed to Dr. Doreene Burke

## 2014-07-07 NOTE — Telephone Encounter (Signed)
Ms. Izora Gala, [place] nurse calling to check on the status of the documents that were faxed over to office on 06/22/14. Izora Gala informed that the physician has up to 14 days to have forms completed and sent back. Izora Gala would just like an update if possible, so that she knows what to tell patient when she inquires. Please assist. (refer to previous not dated 06/22/14 for more information). Thank you.     Do you have papers?

## 2014-07-19 DIAGNOSIS — M25511 Pain in right shoulder: Secondary | ICD-10-CM | POA: Diagnosis not present

## 2014-07-19 DIAGNOSIS — G249 Dystonia, unspecified: Secondary | ICD-10-CM | POA: Diagnosis not present

## 2014-07-19 DIAGNOSIS — M791 Myalgia: Secondary | ICD-10-CM | POA: Diagnosis not present

## 2014-07-19 DIAGNOSIS — Z79891 Long term (current) use of opiate analgesic: Secondary | ICD-10-CM | POA: Diagnosis not present

## 2014-07-19 DIAGNOSIS — M542 Cervicalgia: Secondary | ICD-10-CM | POA: Diagnosis not present

## 2014-07-28 ENCOUNTER — Telehealth: Payer: Self-pay | Admitting: Internal Medicine

## 2014-07-28 NOTE — Telephone Encounter (Signed)
Pt. Was seen at urgent care and was advised that she would need to see [place] ( Dr. Hulan Saas) for her shoulder....pt.  needs a referral from her PCP in order to be seen by Maryanna Shape. Patient would like to know if she can be given the referral without seeing her PCP or will she have to schedule an appointment?

## 2014-08-09 ENCOUNTER — Encounter: Payer: Self-pay | Admitting: Internal Medicine

## 2014-08-09 ENCOUNTER — Ambulatory Visit: Payer: Medicare Other | Attending: Internal Medicine | Admitting: Internal Medicine

## 2014-08-09 VITALS — BP 111/68 | HR 90 | Temp 98.8°F | Resp 16 | Ht 64.0 in | Wt 117.0 lb

## 2014-08-09 DIAGNOSIS — E780 Pure hypercholesterolemia: Secondary | ICD-10-CM | POA: Insufficient documentation

## 2014-08-09 DIAGNOSIS — M25511 Pain in right shoulder: Secondary | ICD-10-CM | POA: Insufficient documentation

## 2014-08-09 DIAGNOSIS — G249 Dystonia, unspecified: Secondary | ICD-10-CM

## 2014-08-09 DIAGNOSIS — E063 Autoimmune thyroiditis: Secondary | ICD-10-CM | POA: Diagnosis not present

## 2014-08-09 DIAGNOSIS — E785 Hyperlipidemia, unspecified: Secondary | ICD-10-CM | POA: Insufficient documentation

## 2014-08-09 NOTE — Progress Notes (Signed)
Patient ID: Megan Hodges, female   DOB: September 07, 1971, 43 y.o.   MRN: [id number]   Megan Hodges, is a 42 y.o. female  OHF:[id number]  MCE:[id number]  DOB - [birth year]  Chief Complaint  Patient presents with  . Follow-up        Subjective:   Megan Hodges is a 43 y.o. female here today for a follow up visit. Patient with generalized dystonia, Hashimoto's thyroiditis, chronic migraine headache and dyslipidemia here today for a request to be referred to orthopedic surgery. She is complaining of right shoulder pain, seen in the urgent care for the same complaint and was advised to see an orthopedic surgeon Dr. Hulan Saas, she is here for that referral. Patient is currently in pain management clinic. She takes high doses of opiates daily. She has this worsening shoulder pain for the past 1 month without injury, no swelling, no redness. She is concerned that she may have torn her rotator cuff. Patient has No headache, No chest pain, No abdominal pain - No Nausea, No new weakness tingling or numbness, No Cough - SOB.  No problems updated.  ALLERGIES: Allergies  Allergen Reactions  . Epinephrine   . Lidocaine     PAST MEDICAL HISTORY: Past Medical History  Diagnosis Date  . Generalized dystonia   . Hypercholesteremia   . Hashimoto's disease   . Migraines     MEDICATIONS AT HOME: Prior to Admission medications   Medication Sig Start Date End Date Taking? Authorizing Provider  baclofen (LIORESAL) 20 MG tablet Take 20 mg by mouth 4 (four) times daily.   Yes Historical Provider, MD  CAMRESE 0.15-0.03 &0.01 MG tablet TAKE 1 TABLET BY MOUTH DAILY. 03/15/14  Yes Tresa Garter, MD  carbidopa-levodopa-entacapone (STALEVO) 12.5-50-200 MG per tablet Take 1 tablet by mouth 4 (four) times daily.   Yes Historical Provider, MD  dantrolene (DANTRIUM) 50 MG capsule Take 50 mg by mouth 4 (four) times daily.   Yes Historical Provider, MD  diazepam (VALIUM) 10 MG tablet Take 10 mg by mouth every 6 (six)  hours as needed for anxiety.   Yes Historical Provider, MD  HYDROmorphone (DILAUDID) 2 MG tablet Take by mouth daily.    Yes Historical Provider, MD  HYDROmorphone HCl (EXALGO) 12 MG T24A SR tablet Take 16 mg by mouth daily.   Yes Historical Provider, MD  levothyroxine (SYNTHROID, LEVOTHROID) 25 MCG tablet Take 2 tablets (50 mcg total) by mouth daily before breakfast. 06/28/14  Yes Philemon Kingdom, MD  Linaclotide (LINZESS PO) Take by mouth.   Yes Historical Provider, MD  ondansetron (ZOFRAN) 4 MG tablet Take 1 tablet (4 mg total) by mouth every 8 (eight) hours as needed for nausea or vomiting. 11/30/13  Yes Tresa Garter, MD  pramipexole (MIRAPEX) 0.5 MG tablet Take 0.5 mg by mouth 3 (three) times daily.   Yes Historical Provider, MD  Red Yeast Rice Extract (RED YEAST RICE PO) Take by mouth daily.   Yes Historical Provider, MD  rizatriptan (MAXALT) 10 MG tablet TAKE 1 TABLET (10 MG TOTAL) BY MOUTH AS NEEDED FOR MIGRAINE. MAY REPEAT IN 2 HOURS IF NEEDED 05/25/14  Yes Tresa Garter, MD  PredniSONE 5 MG KIT 12 day dose pack po 06/29/14   Gregor Hams, MD     Objective:   Filed Vitals:   08/09/14 1416  BP: 111/68  Pulse: 90  Temp: 98.8 F (37.1 C)  TempSrc: Oral  Resp: 16  Height: 5' 4" (1.626 m)  Weight: 117 lb (  53.071 kg)  SpO2: 99%    Exam General appearance : Awake, alert, not in any distress. Speech Clear. Not toxic looking HEENT: Atraumatic and Normocephalic, pupils equally reactive to light and accomodation Neck: supple, no JVD. No cervical lymphadenopathy.  Chest:Good air entry bilaterally, no added sounds  CVS: S1 S2 regular, no murmurs.  Abdomen: Bowel sounds present, Non tender and not distended with no gaurding, rigidity or rebound. Extremities: B/L Lower Ext shows no edema, both legs are warm to touch. Right shoulder pain with limited range of motion due to pain Neurology: Awake alert, and oriented X 3, CN II-XII intact, Non focal  Data Review No results  found for: HGBA1C   Assessment & Plan   1. Generalized dystonia  - Ambulatory referral to Orthopedic Surgery   Return in about 6 months (around 02/07/2015), or if symptoms worsen or fail to improve, for Follow up Pain and comorbidities.  The patient was given clear instructions to go to ER or return to medical center if symptoms don't improve, worsen or new problems develop. The patient verbalized understanding. The patient was told to call to get lab results if they haven't heard anything in the next week.   This note has been created with Surveyor, quantity. Any transcriptional errors are unintentional.    [name], MD, [place], [place], [place] and [place], [place]   08/09/2014, 3:02 PM

## 2014-08-09 NOTE — Progress Notes (Signed)
Pt is here presenting right shoulder pain. Pt is in pain management and needs an orthopedic referral.

## 2014-08-09 NOTE — Patient Instructions (Signed)
Dystonias The dystonias are movement disorders in which sustained muscle contractions cause twisting and repetitive movements or abnormal postures. The movements, which are involuntary and sometimes painful, may affect a single muscle; a group of muscles such as those in the arms, legs, or neck; or the entire body. Early symptoms (problems) may include a deterioration in handwriting after writing several lines, foot cramps, and a tendency of one foot to pull up or drag after running or walking some distance. Other possible symptoms are tremor and voice or speech difficulties. Birth injury (particularly due to lack of oxygen), certain infections, reactions to certain drugs, heavy-metal or carbon monoxide poisoning, trauma (damage caused by an accident), or stroke can cause dystonic symptoms. About half the cases of dystonia have no connection to disease or injury and are called primary or idiopathic dystonia. Of the primary dystonias, many cases appear to be inherited in a dominant manner. Dystonias can also be symptoms of other diseases, some of which may be hereditary (passed down from parents). In some individuals, symptoms of a dystonia appear spontaneously in childhood between the ages of 5 and 16, usually in the foot or in the hand. For other individuals, the symptoms emerge in late adolescence or early adulthood. TREATMENT  No one treatment has been found universally effective for dystonia. Instead, physicians use a variety of therapies (medications, surgery and other treatments such as physical therapy, splinting, stress management, and biofeedback), aimed at reducing or eliminating muscle spasms and pain. Since response to drugs varies among patients and even in the same person over time, the therapy must be individualized. PROGNOSIS The initial symptoms can be very mild and may be noticeable only after prolonged exertion, stress, or fatigue. Over a period of time, the symptoms may become more  noticeable and widespread and be unrelenting; sometimes, however, there is little or no progression. RESEARCH BEING DONE Investigators believe that the dystonias result from an abnormality in an area of the brain called the basal ganglia, where some of the messages that initiate muscle contractions are processed. Scientists suspect a defect in the body's ability to process a group of chemicals called neurotransmitters that help cells in the brain communicate with each other. Scientists at the [facility] laboratories have conducted detailed investigations of the pattern of muscle activity in persons with dystonias. Studies using EEG analysis and neuroimaging are probing brain activity. The search for the gene or genes responsible for some forms of dominantly inherited dystonias continues. 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 In 1997, the team sequenced the DYT1 gene and found that it codes for a previously unknown protein now called "torsin A." Document Released: 09/14/2002 Document Revised: 12/17/2011 Document Reviewed: 11/18/2013 [employer] Patient Information 2015 [employer]. This information is not intended to replace advice given to you by your health care provider. Make sure you discuss any questions you have with your health care provider.  

## 2014-08-16 ENCOUNTER — Ambulatory Visit: Payer: Medicare Other

## 2014-08-16 ENCOUNTER — Ambulatory Visit (INDEPENDENT_AMBULATORY_CARE_PROVIDER_SITE_OTHER)
Admission: RE | Admit: 2014-08-16 | Discharge: 2014-08-16 | Disposition: A | Discharge status: Home / Self Care | Payer: Medicare Other | Source: Ambulatory Visit | Attending: Family Medicine | Admitting: Family Medicine

## 2014-08-16 ENCOUNTER — Encounter: Payer: Self-pay | Admitting: Family Medicine

## 2014-08-16 ENCOUNTER — Ambulatory Visit (INDEPENDENT_AMBULATORY_CARE_PROVIDER_SITE_OTHER): Payer: Medicare Other | Admitting: Family Medicine

## 2014-08-16 VITALS — BP 102/64 | HR 77 | Ht 64.0 in | Wt 118.0 lb

## 2014-08-16 DIAGNOSIS — M25511 Pain in right shoulder: Secondary | ICD-10-CM

## 2014-08-16 DIAGNOSIS — S4991XA Unspecified injury of right shoulder and upper arm, initial encounter: Secondary | ICD-10-CM | POA: Diagnosis not present

## 2014-08-16 DIAGNOSIS — E46 Unspecified protein-calorie malnutrition: Secondary | ICD-10-CM | POA: Diagnosis not present

## 2014-08-16 DIAGNOSIS — M6248 Contracture of muscle, other site: Secondary | ICD-10-CM | POA: Diagnosis not present

## 2014-08-16 DIAGNOSIS — G249 Dystonia, unspecified: Secondary | ICD-10-CM

## 2014-08-16 DIAGNOSIS — M62838 Other muscle spasm: Secondary | ICD-10-CM | POA: Insufficient documentation
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
## 2014-08-16 NOTE — Assessment & Plan Note (Signed)
Patient was given trigger point injections today in 4 different areas of the right trapezius to see if we can make some improvement. Discussed icing protocol and was given home exercise program. I think that this will only be temporary until patient relieves the underlying problem with his the muscle imbalances and weakness.

## 2014-08-16 NOTE — Patient Instructions (Addendum)
Very nice to meet you Ice 20 minutes 2 times daily. Usually after activity and before bed. Exercises 3 times a week.  Consider turmeric 500mg  twice daily.  Vitamin D 2000 IU daily.  We will get xrays today Protein, protein, protein. You are losing too much weight. This would make you ache real bad.  We tried some trigger point injections.  Physical therapy would be helpful. Tell me if you would like to go.  I really think your anxiety is causing a significant amount of your problems dear.  I am here but you need to see a psychiatrist.  I will refer if needed.  See me again in 6 weeks.

## 2014-08-16 NOTE — Assessment & Plan Note (Signed)
Patient will continue to see her neurologist as well as pain management specialist. Concern the patient is on too high dose medications at this time.

## 2014-08-17 DIAGNOSIS — Z79891 Long term (current) use of opiate analgesic: Secondary | ICD-10-CM | POA: Diagnosis not present

## 2014-08-17 DIAGNOSIS — G249 Dystonia, unspecified: Secondary | ICD-10-CM | POA: Diagnosis not present

## 2014-08-17 DIAGNOSIS — M25511 Pain in right shoulder: Secondary | ICD-10-CM | POA: Diagnosis not present

## 2014-08-30 ENCOUNTER — Telehealth: Payer: Self-pay | Admitting: Internal Medicine

## 2014-08-30 NOTE — Telephone Encounter (Signed)
Megan Hodges from [facility] connections calling to follow up on paperwork that was faxed over for home health orders/authorization for patient.  Please f/u with home health organization contact.

## 2014-09-01 ENCOUNTER — Telehealth: Payer: Self-pay | Admitting: Emergency Medicine

## 2014-09-01 NOTE — Telephone Encounter (Signed)
Attempted to reach pt to find out why she needs home health care; number listed unable to accept incoming calls  I will call home health care staff Izora Gala

## 2014-09-17 DIAGNOSIS — G248 Other dystonia: Secondary | ICD-10-CM | POA: Diagnosis not present

## 2014-09-17 DIAGNOSIS — G245 Blepharospasm: Secondary | ICD-10-CM | POA: Diagnosis not present

## 2014-09-17 DIAGNOSIS — G243 Spasmodic torticollis: Secondary | ICD-10-CM | POA: Diagnosis not present

## 2014-09-27 ENCOUNTER — Ambulatory Visit: Payer: Medicare Other | Admitting: Family Medicine

## 2014-09-27 ENCOUNTER — Telehealth: Payer: Self-pay | Admitting: Internal Medicine

## 2014-09-27 NOTE — Telephone Encounter (Signed)
Please read note below and advise in Dr Arman Filter absence. Thank you.

## 2014-09-27 NOTE — Telephone Encounter (Signed)
Patient would like to know if by her taking the levothyroxine is interfering with the Stalevo and Mirapex she is taking, because she is not feeling good at all. Please advise

## 2014-09-28 ENCOUNTER — Other Ambulatory Visit: Payer: Self-pay | Admitting: *Deleted

## 2014-09-28 DIAGNOSIS — E063 Autoimmune thyroiditis: Secondary | ICD-10-CM

## 2014-09-28 NOTE — Telephone Encounter (Signed)
Called pt and advised her. Pt states that she has felt this way for 2 mos and was unable to call our office due to feeling overwhelmed and unable to get her faculties together enough to call. Pt said she saw an ortho dr and he would not release her x-ray/images to her other dr's. Pt talked about all her other problems that are "related" to her inability to contact our office prior to this week. Pt feels that there is a reason that she is feeling like this and believes that the medication may have something to do with it. Pt plans to come in tomorrow to have labs done. Be advised.

## 2014-09-28 NOTE — Telephone Encounter (Signed)
Not that I am aware of. She needs another TSH and fT4 check since last were 3 mo ago >> can you please order?

## 2014-09-29 ENCOUNTER — Other Ambulatory Visit (INDEPENDENT_AMBULATORY_CARE_PROVIDER_SITE_OTHER): Payer: Medicare Other

## 2014-09-29 DIAGNOSIS — E063 Autoimmune thyroiditis: Secondary | ICD-10-CM

## 2014-09-29 LAB — TSH: TSH: 3.84 u[IU]/mL (ref 0.35–4.50)

## 2014-09-29 LAB — T4, FREE: FREE T4: 0.94 ng/dL (ref 0.60–1.60)

## 2014-09-29 NOTE — Telephone Encounter (Signed)
Will see what TFTs show, but I doubt she feels poorly 2/2 thyroid ds.

## 2014-09-29 NOTE — Telephone Encounter (Signed)
Called pt and advised her. Pt to come have labs.

## 2014-10-12 ENCOUNTER — Other Ambulatory Visit: Payer: Self-pay | Admitting: Internal Medicine

## 2014-11-15 DIAGNOSIS — Z79891 Long term (current) use of opiate analgesic: Secondary | ICD-10-CM | POA: Diagnosis not present

## 2014-11-15 DIAGNOSIS — M25511 Pain in right shoulder: Secondary | ICD-10-CM | POA: Diagnosis not present

## 2014-11-15 DIAGNOSIS — M25512 Pain in left shoulder: Secondary | ICD-10-CM | POA: Diagnosis not present

## 2014-11-15 DIAGNOSIS — G249 Dystonia, unspecified: Secondary | ICD-10-CM | POA: Diagnosis not present

## 2014-12-01 DIAGNOSIS — M25511 Pain in right shoulder: Secondary | ICD-10-CM | POA: Diagnosis not present

## 2014-12-01 DIAGNOSIS — G249 Dystonia, unspecified: Secondary | ICD-10-CM | POA: Diagnosis not present
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
## 2014-12-22 DIAGNOSIS — G245 Blepharospasm: Secondary | ICD-10-CM | POA: Diagnosis not present

## 2014-12-22 DIAGNOSIS — G243 Spasmodic torticollis: Secondary | ICD-10-CM | POA: Diagnosis not present

## 2014-12-22 DIAGNOSIS — G248 Other dystonia: Secondary | ICD-10-CM | POA: Diagnosis not present

## 2015-01-10 ENCOUNTER — Telehealth: Payer: Self-pay | Admitting: General Practice

## 2015-01-10 NOTE — Telephone Encounter (Signed)
Patient calling to request a referral to [place] [phone]), patient states she will be going there for MRI tomorrow, 01/11/15, and needs referral due to medicaid.  Made patient aware that her PCP is out of the Clinic until this Thursday, 01/13/15. Informed pt. That I would forward this information to the nurse to see what it is that they can do to assist. Patient states she has worked hard to get this MRI done.  Patient states PCP informed her that whenever she needs a referral to just call in, due to neurological issues she has. Please assist

## 2015-01-10 NOTE — Telephone Encounter (Signed)
Patient calling to request a referral to [place] [phone]), patient states she will be going there for MRI tomorrow, 01/11/15, and needs referral due to medicaid.  Made patient aware that her PCP is out of the Clinic until this Thursday, 01/13/15. Informed pt. That I would forward this information to the nurse to see what it is that they can do to assist. Patient states she has worked hard to get this MRI done.  Patient states PCP informed her that whenever she needs a referral to just call in, due to neurological issues she has. Please assist

## 2015-01-11 ENCOUNTER — Other Ambulatory Visit: Payer: Self-pay | Admitting: *Deleted

## 2015-01-11 DIAGNOSIS — G249 Dystonia, unspecified: Secondary | ICD-10-CM

## 2015-01-11 DIAGNOSIS — M25511 Pain in right shoulder: Secondary | ICD-10-CM | POA: Diagnosis not present

## 2015-01-11 NOTE — Progress Notes (Signed)
Pt is scheduled for an MRI today. Pt needed another referral today so she could have the MRI. I placed the referral to orthopedics and made her make another appointment,

## 2015-01-14 ENCOUNTER — Other Ambulatory Visit: Payer: Self-pay | Admitting: Internal Medicine

## 2015-01-17 DIAGNOSIS — G249 Dystonia, unspecified: Secondary | ICD-10-CM | POA: Diagnosis not present

## 2015-01-17 DIAGNOSIS — M25512 Pain in left shoulder: Secondary | ICD-10-CM | POA: Diagnosis not present

## 2015-01-17 DIAGNOSIS — Z79899 Other long term (current) drug therapy: Secondary | ICD-10-CM | POA: Diagnosis not present

## 2015-01-17 DIAGNOSIS — M542 Cervicalgia: Secondary | ICD-10-CM | POA: Diagnosis not present

## 2015-01-17 DIAGNOSIS — Z79891 Long term (current) use of opiate analgesic: Secondary | ICD-10-CM | POA: Diagnosis not present

## 2015-01-17 DIAGNOSIS — M25511 Pain in right shoulder: Secondary | ICD-10-CM | POA: Diagnosis not present

## 2015-01-18 DIAGNOSIS — M25512 Pain in left shoulder: Secondary | ICD-10-CM | POA: Diagnosis not present

## 2015-01-18 DIAGNOSIS — M25511 Pain in right shoulder: Secondary | ICD-10-CM | POA: Diagnosis not present

## 2015-01-20 DIAGNOSIS — M25511 Pain in right shoulder: Secondary | ICD-10-CM | POA: Diagnosis not present

## 2015-01-20 DIAGNOSIS — M25512 Pain in left shoulder: Secondary | ICD-10-CM | POA: Diagnosis not present

## 2015-01-25 ENCOUNTER — Encounter: Payer: Medicare Other | Admitting: Internal Medicine

## 2015-01-27 ENCOUNTER — Other Ambulatory Visit (HOSPITAL_COMMUNITY)
Admission: RE | Admit: 2015-01-27 | Discharge: 2015-01-27 | Disposition: A | Discharge status: Home / Self Care | Payer: Medicare Other | Source: Ambulatory Visit | Attending: Internal Medicine | Admitting: Internal Medicine

## 2015-01-27 ENCOUNTER — Encounter: Payer: Self-pay | Admitting: Internal Medicine

## 2015-01-27 ENCOUNTER — Ambulatory Visit: Payer: Medicare Other | Attending: Internal Medicine | Admitting: Internal Medicine

## 2015-01-27 VITALS — BP 130/84 | HR 100 | Temp 99.0°F | Ht 64.0 in | Wt 128.0 lb

## 2015-01-27 DIAGNOSIS — N760 Acute vaginitis: Secondary | ICD-10-CM | POA: Diagnosis not present

## 2015-01-27 DIAGNOSIS — Z0000 Encounter for general adult medical examination without abnormal findings: Secondary | ICD-10-CM | POA: Insufficient documentation

## 2015-01-27 DIAGNOSIS — G249 Dystonia, unspecified: Secondary | ICD-10-CM

## 2015-01-27 DIAGNOSIS — E785 Hyperlipidemia, unspecified: Secondary | ICD-10-CM

## 2015-01-27 DIAGNOSIS — B3731 Acute candidiasis of vulva and vagina: Secondary | ICD-10-CM

## 2015-01-27 DIAGNOSIS — B373 Candidiasis of vulva and vagina: Secondary | ICD-10-CM | POA: Diagnosis not present

## 2015-01-27 DIAGNOSIS — Z113 Encounter for screening for infections with a predominantly sexual mode of transmission: Secondary | ICD-10-CM | POA: Diagnosis not present

## 2015-01-27 DIAGNOSIS — Z124 Encounter for screening for malignant neoplasm of cervix: Secondary | ICD-10-CM | POA: Diagnosis not present

## 2015-01-27 LAB — LIPID PANEL
Cholesterol: 167 mg/dL (ref 0–200)
HDL: 42 mg/dL — AB (ref >=46)
LDL CALC: 102 mg/dL — AB (ref 0–99)
Total CHOL/HDL Ratio: 4 Ratio
Triglycerides: 115 mg/dL (ref <150)
VLDL: 23 mg/dL (ref 0–40)

## 2015-01-27 LAB — CBC WITH DIFFERENTIAL/PLATELET
Basophils Absolute: 0 10*3/uL (ref 0.0–0.1)
Basophils Relative: 0 % (ref 0–1)
EOS PCT: 1 % (ref 0–5)
Eosinophils Absolute: 0.1 10*3/uL (ref 0.0–0.7)
HCT: 40.1 % (ref 36.0–46.0)
Hemoglobin: 13.2 g/dL (ref 12.0–15.0)
LYMPHS PCT: 34 % (ref 12–46)
Lymphs Abs: 2.5 10*3/uL (ref 0.7–4.0)
MCH: 31.3 pg (ref 26.0–34.0)
MCHC: 32.9 g/dL (ref 30.0–36.0)
MCV: 95 fL (ref 78.0–100.0)
MONO ABS: 0.7 10*3/uL (ref 0.1–1.0)
MPV: 9.8 fL (ref 8.6–12.4)
Monocytes Relative: 10 % (ref 3–12)
Neutro Abs: 4 10*3/uL (ref 1.7–7.7)
Neutrophils Relative %: 55 % (ref 43–77)
Platelets: 246 10*3/uL (ref 150–400)
RBC: 4.22 MIL/uL (ref 3.87–5.11)
RDW: 13.3 % (ref 11.5–15.5)
WBC: 7.3 10*3/uL (ref 4.0–10.5)

## 2015-01-27 LAB — COMPLETE METABOLIC PANEL WITH GFR
ALT: 17 U/L (ref 0–35)
AST: 17 U/L (ref 0–37)
Albumin: 3.6 g/dL (ref 3.5–5.2)
Alkaline Phosphatase: 29 U/L — ABNORMAL LOW (ref 39–117)
BUN: 14 mg/dL (ref 6–23)
CHLORIDE: 105 meq/L (ref 96–112)
CO2: 23 meq/L (ref 19–32)
Calcium: 8.6 mg/dL (ref 8.4–10.5)
Creat: 0.67 mg/dL (ref 0.50–1.10)
GFR, Est Non African American: >89 mL/min
Glucose, Bld: 73 mg/dL (ref 70–99)
Potassium: 4.3 mEq/L (ref 3.5–5.3)
SODIUM: 139 meq/L (ref 135–145)
TOTAL PROTEIN: 5.9 g/dL — AB (ref 6.0–8.3)
Total Bilirubin: 0.3 mg/dL (ref 0.2–1.2)

## 2015-01-27 MED ORDER — FLUCONAZOLE 200 MG PO TABS: 200.0000 mg | ORAL_TABLET | Freq: Once | ORAL | Status: DC

## 2015-01-27 NOTE — Progress Notes (Signed)
Patient ID: Megan Hodges, female   DOB: [birth year], 44 y.o.   MRN: [id number]   Megan Hodges, is a 44 y.o. female  Megan Hodges  Megan Hodges  DOB - Mar 30, 1971  Chief Complaint  Patient presents with  . Annual Exam  . Gynecologic Exam        Subjective:   Megan Hodges is a 44 y.o. female here today for a follow up visit. Patient has generalized dystonia, Hashimoto's thyroiditis, chronic migraine headache, arthritis and dyslipidemia. She is here today for annual physical examination. Her major complaint today is vaginal itching and discharge, no foul-smelling. She is not sexually active. Patient has No headache, No chest pain, No abdominal pain - No Nausea, No new weakness tingling or numbness, No Cough - SOB.  Problem  Annual Physical Exam  Dyslipidemia  Vaginal Candidiasis    ALLERGIES: Allergies  Allergen Reactions  . Epinephrine   . Lidocaine     PAST MEDICAL HISTORY: Past Medical History  Diagnosis Date  . Generalized dystonia   . Hypercholesteremia   . Hashimoto's disease   . Migraines     MEDICATIONS AT HOME: Prior to Admission medications   Medication Sig Start Date End Date Taking? Authorizing Provider  baclofen (LIORESAL) 20 MG tablet Take 20 mg by mouth 4 (four) times daily.   Yes Historical Provider, MD  CAMRESE 0.15-0.03 &0.01 MG tablet TAKE 1 TABLET BY MOUTH DAILY. 03/15/14  Yes Tresa Garter, MD  carbidopa-levodopa-entacapone (STALEVO) 12.5-50-200 MG per tablet Take 1 tablet by mouth 4 (four) times daily.   Yes Historical Provider, MD  dantrolene (DANTRIUM) 50 MG capsule Take 50 mg by mouth 4 (four) times daily.   Yes Historical Provider, MD  diazepam (VALIUM) 10 MG tablet Take 10 mg by mouth every 6 (six) hours as needed for anxiety.   Yes Historical Provider, MD  HYDROmorphone (DILAUDID) 2 MG tablet Take by mouth daily.    Yes Historical Provider, MD  HYDROmorphone HCl (EXALGO) 12 MG T24A SR tablet Take 16 mg by mouth daily.   Yes Historical  Provider, MD  levothyroxine (SYNTHROID, LEVOTHROID) 25 MCG tablet TAKE 2 TABLETS (50 MCG TOTAL) BY MOUTH DAILY BEFORE BREAKFAST. 01/14/15  Yes Philemon Kingdom, MD  Linaclotide (LINZESS PO) Take by mouth.   Yes Historical Provider, MD  ondansetron (ZOFRAN) 4 MG tablet Take 1 tablet (4 mg total) by mouth every 8 (eight) hours as needed for nausea or vomiting. 11/30/13  Yes Tresa Garter, MD  Red Yeast Rice Extract (RED YEAST RICE PO) Take by mouth daily.   Yes Historical Provider, MD  rizatriptan (MAXALT) 10 MG tablet TAKE 1 TABLET (10 MG TOTAL) BY MOUTH AS NEEDED FOR MIGRAINE. MAY REPEAT IN 2 HOURS IF NEEDED 05/25/14  Yes Tresa Garter, MD  fluconazole (DIFLUCAN) 200 MG tablet Take 1 tablet (200 mg total) by mouth once. Repeat in one week 01/27/15   Tresa Garter, MD  pramipexole (MIRAPEX) 0.5 MG tablet Take 0.5 mg by mouth 3 (three) times daily.    Historical Provider, MD  PredniSONE 5 MG KIT 12 day dose pack po Patient not taking: Reported on 01/27/2015 06/29/14   Gregor Hams, MD     Objective:   Filed Vitals:   01/27/15 1523  BP: 130/84  Pulse: 100  Temp: 99 F (37.2 C)  Height: 5' 4"  (1.626 m)  Weight: 128 lb (58.06 kg)  SpO2: 99%    Exam General appearance : Awake, alert, not in any distress. Speech Clear. Not  toxic looking HEENT: Atraumatic and Normocephalic, pupils equally reactive to light and accomodation Neck: supple, no JVD. No cervical lymphadenopathy.  Chest:Good air entry bilaterally, no added sounds  CVS: S1 S2 regular, no murmurs.  Abdomen: Bowel sounds present, Non tender and not distended with no gaurding, rigidity or rebound. Extremities: B/L Lower Ext shows no edema, both legs are warm to touch Neurology: Awake alert, and oriented X 3, CN II-XII intact, Non focal Skin:No Rash  Data Review No results found for: HGBA1C   Assessment & Plan   1. Generalized dystonia  - CBC with Differential/Platelet - COMPLETE METABOLIC PANEL WITH GFR -  Urinalysis, Complete - Ambulatory referral to Orthopedic Surgery  2. Annual physical exam  - Cytology - PAP - Cervicovaginal ancillary only  3. Dyslipidemia  - Lipid panel  To address this please limit saturated fat to no more than 7% of your calories, limit cholesterol to 200 mg/day, increase fiber and exercise as tolerated. If needed we may add cholesterol lowering medication to your regimen.   4. Vaginal candidiasis  - fluconazole (DIFLUCAN) 200 MG tablet; Take 1 tablet (200 mg total) by mouth once. Repeat in one week  Dispense: 6 tablet; Refill: 0  Patient have been counseled extensively about nutrition and exercise  Return in about 6 months (around 07/29/2015), or if symptoms worsen or fail to improve, for Follow up Pain and comorbidities.  The patient was given clear instructions to go to ER or return to medical center if symptoms don't improve, worsen or new problems develop. The patient verbalized understanding. The patient was told to call to get lab results if they haven't heard anything in the next week.   This note has been created with Surveyor, quantity. Any transcriptional errors are unintentional.    [name], MD, [place], [place], [place], [place] and [place], [place]   01/27/2015, 4:11 PM

## 2015-01-27 NOTE — Patient Instructions (Signed)
Dystonias The dystonias are movement disorders in which sustained muscle contractions cause twisting and repetitive movements or abnormal postures. The movements, which are involuntary and sometimes painful, may affect a single muscle; a group of muscles such as those in the arms, legs, or neck; or the entire body. Early symptoms (problems) may include a deterioration in handwriting after writing several lines, foot cramps, and a tendency of one foot to pull up or drag after running or walking some distance. Other possible symptoms are tremor and voice or speech difficulties. Birth injury (particularly due to lack of oxygen), certain infections, reactions to certain drugs, heavy-metal or carbon monoxide poisoning, trauma (damage caused by an accident), or stroke can cause dystonic symptoms. About half the cases of dystonia have no connection to disease or injury and are called primary or idiopathic dystonia. Of the primary dystonias, many cases appear to be inherited in a dominant manner. Dystonias can also be symptoms of other diseases, some of which may be hereditary (passed down from parents). In some individuals, symptoms of a dystonia appear spontaneously in childhood between the ages of 5 and 16, usually in the foot or in the hand. For other individuals, the symptoms emerge in late adolescence or early adulthood. TREATMENT  No one treatment has been found universally effective for dystonia. Instead, physicians use a variety of therapies (medications, surgery and other treatments such as physical therapy, splinting, stress management, and biofeedback), aimed at reducing or eliminating muscle spasms and pain. Since response to drugs varies among patients and even in the same person over time, the therapy must be individualized. PROGNOSIS The initial symptoms can be very mild and may be noticeable only after prolonged exertion, stress, or fatigue. Over a period of time, the symptoms may become more  noticeable and widespread and be unrelenting; sometimes, however, there is little or no progression. RESEARCH BEING DONE Investigators believe that the dystonias result from an abnormality in an area of the brain called the basal ganglia, where some of the messages that initiate muscle contractions are processed. Scientists suspect a defect in the body's ability to process a group of chemicals called neurotransmitters that help cells in the brain communicate with each other. Scientists at the [facility] laboratories have conducted detailed investigations of the pattern of muscle activity in persons with dystonias. Studies using EEG analysis and neuroimaging are probing brain activity. The search for the gene or genes responsible for some forms of dominantly inherited dystonias continues. 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 In 1997, the team sequenced the DYT1 gene and found that it codes for a previously unknown protein now called "torsin A." Document Released: 09/14/2002 Document Revised: 12/17/2011 Document Reviewed: 11/18/2013 [employer] Patient Information 2015 [employer]. This information is not intended to replace advice given to you by your health care provider. Make sure you discuss any questions you have with your health care provider.  

## 2015-01-27 NOTE — Progress Notes (Signed)
Patient here for annual Has dystonia and chronic pain Patient recently did MRI of shoulders She had 2 marcaine last week She stopped Lexapro due to weight gain She complains of vaginal itching

## 2015-01-28 LAB — URINALYSIS, COMPLETE
Bilirubin Urine: NEGATIVE
Casts: NONE SEEN
Crystals: NONE SEEN
Glucose, UA: NEGATIVE mg/dL
HGB URINE DIPSTICK: NEGATIVE
Ketones, ur: NEGATIVE mg/dL
Leukocytes, UA: NEGATIVE
NITRITE: NEGATIVE
Protein, ur: NEGATIVE mg/dL
SPECIFIC GRAVITY, URINE: 1.018 (ref 1.005–1.030)
Urobilinogen, UA: 0.2 mg/dL (ref 0.0–1.0)
pH: 6.5 (ref 5.0–8.0)

## 2015-01-28 LAB — CERVICOVAGINAL ANCILLARY ONLY
Chlamydia: NEGATIVE
NEISSERIA GONORRHEA: NEGATIVE
WET PREP (BD AFFIRM): NEGATIVE

## 2015-01-28 LAB — CYTOLOGY - PAP

## 2015-01-31 ENCOUNTER — Telehealth: Payer: Self-pay

## 2015-01-31 NOTE — Telephone Encounter (Signed)
-----   Message from Tresa Garter, MD sent at 01/28/2015  3:50 PM EDT ----- Please inform patient that her Pap smear cytology is negative for malignancy, and also negative for infections. Her cholesterol was slightly high but is better than previous results, to address this please limit saturated fat to no more than 7% of your calories, limit cholesterol to 200 mg/day, increase fiber and exercise as tolerateded.

## 2015-01-31 NOTE — Telephone Encounter (Signed)
Patient not available Left message on voice mail to return our call 

## 2015-02-02 ENCOUNTER — Other Ambulatory Visit: Payer: Self-pay | Admitting: Internal Medicine

## 2015-02-03 ENCOUNTER — Other Ambulatory Visit: Payer: Self-pay

## 2015-02-03 DIAGNOSIS — B3731 Acute candidiasis of vulva and vagina: Secondary | ICD-10-CM

## 2015-02-03 DIAGNOSIS — B373 Candidiasis of vulva and vagina: Secondary | ICD-10-CM

## 2015-02-03 MED ORDER — FLUCONAZOLE 200 MG PO TABS: 200.0000 mg | ORAL_TABLET | Freq: Once | ORAL | Status: DC

## 2015-02-13 ENCOUNTER — Other Ambulatory Visit: Payer: Self-pay | Admitting: Internal Medicine

## 2015-02-14 NOTE — Telephone Encounter (Signed)
Please communicate to the pt: needs appt for further refills!

## 2015-02-22 ENCOUNTER — Other Ambulatory Visit: Payer: Self-pay | Admitting: Internal Medicine

## 2015-03-03 ENCOUNTER — Other Ambulatory Visit: Payer: Self-pay | Admitting: *Deleted

## 2015-03-03 ENCOUNTER — Telehealth: Payer: Self-pay | Admitting: Internal Medicine

## 2015-03-03 MED ORDER — LEVONORGEST-ETH ESTRAD 91-DAY 0.15-0.03 &0.01 MG PO TABS: 1.0000 | ORAL_TABLET | Freq: Every day | ORAL | Status: DC

## 2015-03-03 NOTE — Telephone Encounter (Signed)
Patient called to request a med refill for her birth control, she stated that she has been out for 4 days and now has a heavy period. Patient is also requesting an appointment for an MRI. Please f/u

## 2015-03-09 ENCOUNTER — Other Ambulatory Visit: Payer: Self-pay

## 2015-03-11 ENCOUNTER — Encounter: Payer: Self-pay | Admitting: Internal Medicine

## 2015-03-16 ENCOUNTER — Other Ambulatory Visit: Payer: Self-pay | Admitting: Internal Medicine

## 2015-03-25 DIAGNOSIS — G243 Spasmodic torticollis: Secondary | ICD-10-CM | POA: Diagnosis not present

## 2015-03-25 DIAGNOSIS — G245 Blepharospasm: Secondary | ICD-10-CM | POA: Diagnosis not present

## 2015-03-25 DIAGNOSIS — G248 Other dystonia: Secondary | ICD-10-CM | POA: Diagnosis not present

## 2015-03-31 ENCOUNTER — Other Ambulatory Visit: Payer: Self-pay | Admitting: Internal Medicine

## 2015-04-05 ENCOUNTER — Other Ambulatory Visit: Payer: Self-pay | Admitting: Internal Medicine

## 2015-04-07 DIAGNOSIS — M542 Cervicalgia: Secondary | ICD-10-CM | POA: Diagnosis not present

## 2015-04-08 ENCOUNTER — Telehealth: Payer: Self-pay | Admitting: Internal Medicine

## 2015-04-18 DIAGNOSIS — Z5181 Encounter for therapeutic drug level monitoring: Secondary | ICD-10-CM | POA: Diagnosis not present

## 2015-04-18 DIAGNOSIS — Z79891 Long term (current) use of opiate analgesic: Secondary | ICD-10-CM | POA: Diagnosis not present

## 2015-04-18 DIAGNOSIS — G243 Spasmodic torticollis: Secondary | ICD-10-CM | POA: Diagnosis not present

## 2015-04-18 DIAGNOSIS — M542 Cervicalgia: Secondary | ICD-10-CM | POA: Diagnosis not present

## 2015-04-18 DIAGNOSIS — G249 Dystonia, unspecified: Secondary | ICD-10-CM | POA: Diagnosis not present

## 2015-04-18 DIAGNOSIS — M25512 Pain in left shoulder: Secondary | ICD-10-CM | POA: Diagnosis not present

## 2015-04-18 DIAGNOSIS — M25511 Pain in right shoulder: Secondary | ICD-10-CM | POA: Diagnosis not present

## 2015-04-18 DIAGNOSIS — Z79899 Other long term (current) drug therapy: Secondary | ICD-10-CM | POA: Diagnosis not present

## 2015-05-04 DIAGNOSIS — R252 Cramp and spasm: Secondary | ICD-10-CM | POA: Diagnosis not present

## 2015-05-04 DIAGNOSIS — R52 Pain, unspecified: Secondary | ICD-10-CM | POA: Diagnosis not present

## 2015-05-22 ENCOUNTER — Other Ambulatory Visit: Payer: Self-pay | Admitting: Internal Medicine

## 2015-05-23 NOTE — Telephone Encounter (Signed)
Patient called to request her birth control refill. Please f/u with pt.

## 2015-05-24 ENCOUNTER — Ambulatory Visit (INDEPENDENT_AMBULATORY_CARE_PROVIDER_SITE_OTHER): Payer: Medicare Other | Admitting: Internal Medicine

## 2015-05-24 ENCOUNTER — Other Ambulatory Visit: Payer: Self-pay

## 2015-05-24 VITALS — BP 112/68 | HR 86 | Temp 98.5°F | Resp 12 | Wt 128.0 lb

## 2015-05-24 DIAGNOSIS — E063 Autoimmune thyroiditis: Secondary | ICD-10-CM

## 2015-05-24 LAB — TSH: TSH: 2.92 u[IU]/mL (ref 0.35–4.50)

## 2015-05-24 LAB — T4, FREE: FREE T4: 0.78 ng/dL (ref 0.60–1.60)

## 2015-05-24 MED ORDER — LEVONORGEST-ETH ESTRAD 91-DAY 0.15-0.03 &0.01 MG PO TABS: 1.0000 | ORAL_TABLET | Freq: Every day | ORAL | Status: DC

## 2015-05-24 NOTE — Progress Notes (Signed)
Patient ID: Megan Hodges, female   DOB: 13-Apr-1971, 44 y.o.   MRN: [id number]   HPI  Megan Hodges is a 44 y.o.-year-old female, returning for f/u for hypothyroidism 2/2 Hashimoto's hypothyroidism. Last visit 1 year ago.  Reviewed and addended hx: Pt. has been found to have a low TSH in 06/2013. She tells me that she was not started on thyroid hh as a repeat TSH was normal (however, this was drawn close to her Stalevo dose). Another TSH drawn later returned high (12h after Stalevo), and also, her TPO antibodies returned elevated, confirming Hashimoto's thyroiditis.  At last visit, TSH was high >> we started LT4 25 mcg daily >> subsequent TFTs have normalized, but she was lost for f/u.  She is taking the LT4: - fasting - in am - with water - >30 min before b'fast - no PPI, MVI, Ca, Fe  I reviewed pt's thyroid tests:  Lab Results  Component Value Date   TSH 3.84 09/29/2014   TSH 4.60* 06/22/2014   TSH 11.12* 05/17/2014   TSH 12.401* 03/26/2014   FREET4 0.94 09/29/2014   FREET4 0.84 06/22/2014   FREET4 0.66 05/17/2014  TPO 617 ATA <20 TSI 42 (<140%)  Pt describes: - +++ mm aches and spasms - travelling to [place], will have Deep Brain Stimulation at [facility] - + fatigue - + poor sleep - + weight gain - she was on lexapro in winter >> gained weight, but was confused >> stopped >> gained more weight (18 lbs) - + Hot flushes - + Bloating, liquid stools - + dry skin - +++ depression  She is on OCPs >>  7 days off now >> needs to restart new pack.She wonders if she is menopausal.  She has depression, arthritis, irritability, exacerbating migraines, ?gluten intolerance,   Pt denies feeling nodules in neck, + hoarseness, + dysphagia/no odynophagia, SOB with lying down.  She has + FH of thyroid disorders in: mother (Hashimoto's), aunt with Graves.   I reviewed her chart and she also has a history of of general dystonia. She is on injections (similar to Botox) for this every 3 mo.She  also has HL, anxiety/depression. She is on OCPs.   ROS: Constitutional: see HPI Eyes: + blurry vision, no xerophthalmia ENT: + sore throat, no nodules palpated in throat, + dysphagia occas./no odynophagia, no hoarseness Cardiovascular: no CP/+ SOB/no palpitations/+ leg swelling Respiratory: no cough/+ SOB Gastrointestinal: no N/V/D/C/+ bloating Musculoskeletal: +++ muscle aches and spasms/+ joint aches Skin: no rashes, + itching Neurological: no tremors/numbness/tingling/dizziness Psychiatric: + both depression/anxiety  I reviewed pt's medications, allergies, PMH, social hx, family hx, and changes were documented in the history of present illness. Otherwise, unchanged from my initial visit note. She increased the dose of Dilaudid -2 mg IR, she is off Mirapex. She has been on Lexapro, but did not tolerate it. She stopped smoking.  Past Medical History  Diagnosis Date  . Generalized dystonia   . Hypercholesteremia   . Hashimoto's disease   . Migraines    Past Surgical History  Procedure Laterality Date  . Cesarean section    . Spine surgery      L5-S1   History   Social History  . Marital Status: single    Spouse Name: N/A    Number of Children: 1: 45 y/o   Occupational History  . disabled   Social History Main Topics  . Smoking status: former Smoker -- 1.00 packs/day - quit 01/2014    Types: E-cigarettes  . Smokeless  tobacco: Never Used  . Alcohol Use: No  . Drug Use: No   Current Outpatient Prescriptions on File Prior to Visit  Medication Sig Dispense Refill  . baclofen (LIORESAL) 20 MG tablet Take 20 mg by mouth 4 (four) times daily.    . carbidopa-levodopa-entacapone (STALEVO) 12.5-50-200 MG per tablet Take 1 tablet by mouth 4 (four) times daily.    . dantrolene (DANTRIUM) 50 MG capsule Take 50 mg by mouth 4 (four) times daily.    . diazepam (VALIUM) 10 MG tablet Take 10 mg by mouth every 6 (six) hours as needed for anxiety.    . fluconazole (DIFLUCAN) 200 MG  tablet Take 1 tablet (200 mg total) by mouth once. Repeat in one week 2 tablet 0  . HYDROmorphone (DILAUDID) 2 MG tablet Take by mouth daily.     Marland Kitchen HYDROmorphone HCl (EXALGO) 12 MG T24A SR tablet Take 16 mg by mouth daily.    . Levonorgestrel-Ethinyl Estradiol (CAMRESE) 0.15-0.03 &0.01 MG tablet Take 1 tablet by mouth daily. 91 tablet 0  . levothyroxine (SYNTHROID, LEVOTHROID) 25 MCG tablet TAKE 2 TABLETS (50 MCG TOTAL) BY MOUTH DAILY BEFORE BREAKFAST. 60 tablet 1  . levothyroxine (SYNTHROID, LEVOTHROID) 25 MCG tablet TAKE 2 TABLETS (50 MCG TOTAL) BY MOUTH DAILY BEFORE BREAKFAST. 60 tablet 2  . Linaclotide (LINZESS PO) Take by mouth.    . ondansetron (ZOFRAN) 4 MG tablet Take 1 tablet (4 mg total) by mouth every 8 (eight) hours as needed for nausea or vomiting. 30 tablet 3  . pramipexole (MIRAPEX) 0.5 MG tablet Take 0.5 mg by mouth 3 (three) times daily.    . Red Yeast Rice Extract (RED YEAST RICE PO) Take by mouth daily.    . rizatriptan (MAXALT) 10 MG tablet TAKE 1 TABLET (10 MG TOTAL) BY MOUTH AS NEEDED FOR MIGRAINE. MAY REPEAT IN 2 HOURS IF NEEDED 10 tablet 1  . PredniSONE 5 MG KIT 12 day dose pack po (Patient not taking: Reported on 05/24/2015) 1 kit 0   No current facility-administered medications on file prior to visit.   Allergies  Allergen Reactions  . Epinephrine   . Lidocaine    FH: see HPI  PE: BP 112/68 mmHg  Pulse 86  Temp(Src) 98.5 F (36.9 C) (Oral)  Resp 12  Wt 128 lb (58.06 kg)  SpO2 98% Wt Readings from Last 3 Encounters:  05/24/15 128 lb (58.06 kg)  01/27/15 128 lb (58.06 kg)  08/16/14 118 lb (53.524 kg)   Constitutional: thin, in distress b/c shoulder/neck mm spasms - she appears very uncomfortable  Eyes: PERRLA, EOMI, no exophthalmos ENT: moist mucous membranes, no thyromegaly, no cervical lymphadenopathy Cardiovascular: RRR, No MRG Respiratory: CTA B Gastrointestinal: abdomen soft, NT, ND, BS+ Musculoskeletal: no deformities, strength not tested 2/2  distress Skin: moist, warm, no rashes Neurological: no tremor with outstretched hands  ASSESSMENT: 1. Hypothyroidism - Hashimoto's ds  PLAN:  1. Patient with 1 year h/o hypothyroidism, on minimal dose of levothyroxine. She returns after a 1 year absence >> her physical and mental condition has not improved and she feels worse. Most of her concerns are out of the scope of my practice >> she feels distressed due to her muscle spasms, fatigue, depression, headaches, and GI sxs. She also would like to be tested for menopause. She is now on OCPs  (Until she is off OCPs for 2-3 months, we cannot check her [place], as this will be suppressed) and she tells me that she cannot come off them  because her symptoms exacerbate: Headaches, dizziness, more muscle pain. She would like to be tested for menopause, however she cannot come off oral contraceptiives, so in this case, I discussed with her that there is no point in checking her to see if she is menopausal.  She feels she needs counseling and maybe even psychiatric help, and I agree. She feels alone and has nobody to speak with, she also feels she does not have all the medical help she needs ("nobody is listening to me"). She would like to be referred to a PCP w/in [place] >> [facility] office is closer to her. She will likely benefit from the office counselor. - She does not appear to have a goiter, thyroid nodules, or neck compression symptoms.  - will check thyroid tests in am: TSH, free T4 - advised her to take Levothyroxine EVERY DAY, with water, >30 min before b'fast, separated by >4h from anti acid medication, calcium, iron, MVI.  - If these are abnormal, she will need to return in 6-8 weeks for repeat labs Return in about 6 months (around 11/24/2015).  Office Visit on 05/24/2015  Component Date Value Ref Range Status  . TSH 05/24/2015 2.92  0.35 - 4.50 uIU/mL Final  . Free T4 05/24/2015 0.78  0.60 - 1.60 ng/dL Final   TFTs normal. Continue same  dose of levothyroxine, 25 g daily

## 2015-05-24 NOTE — Patient Instructions (Signed)
Please stop at the lab.  Please come back for a follow-up appointment in 6 months.  Please schedule an appt with a [facility] PCP.

## 2015-05-25 ENCOUNTER — Encounter: Payer: Self-pay | Admitting: Internal Medicine

## 2015-05-25 ENCOUNTER — Telehealth: Payer: Self-pay

## 2015-05-25 NOTE — Telephone Encounter (Signed)
Gave her the test results.

## 2015-05-26 ENCOUNTER — Encounter: Payer: Self-pay | Admitting: Internal Medicine

## 2015-06-02 ENCOUNTER — Encounter: Payer: Self-pay | Admitting: Internal Medicine

## 2015-07-01 DIAGNOSIS — G243 Spasmodic torticollis: Secondary | ICD-10-CM | POA: Diagnosis not present

## 2015-07-01 DIAGNOSIS — G245 Blepharospasm: Secondary | ICD-10-CM | POA: Diagnosis not present

## 2015-07-01 DIAGNOSIS — G248 Other dystonia: Secondary | ICD-10-CM | POA: Diagnosis not present

## 2015-07-11 DIAGNOSIS — M542 Cervicalgia: Secondary | ICD-10-CM | POA: Diagnosis not present

## 2015-07-11 DIAGNOSIS — Z79899 Other long term (current) drug therapy: Secondary | ICD-10-CM | POA: Diagnosis not present

## 2015-07-11 DIAGNOSIS — Z79891 Long term (current) use of opiate analgesic: Secondary | ICD-10-CM | POA: Diagnosis not present

## 2015-07-11 DIAGNOSIS — M25511 Pain in right shoulder: Secondary | ICD-10-CM | POA: Diagnosis not present

## 2015-07-11 DIAGNOSIS — G249 Dystonia, unspecified: Secondary | ICD-10-CM | POA: Diagnosis not present

## 2015-07-11 DIAGNOSIS — M5412 Radiculopathy, cervical region: Secondary | ICD-10-CM | POA: Diagnosis not present

## 2015-07-11 DIAGNOSIS — M25512 Pain in left shoulder: Secondary | ICD-10-CM | POA: Diagnosis not present

## 2015-07-14 ENCOUNTER — Telehealth: Payer: Self-pay | Admitting: Internal Medicine

## 2015-07-14 ENCOUNTER — Ambulatory Visit: Payer: Medicare Other | Attending: Internal Medicine | Admitting: Internal Medicine

## 2015-07-14 ENCOUNTER — Encounter: Payer: Self-pay | Admitting: Internal Medicine

## 2015-07-14 VITALS — BP 132/67 | HR 71 | Temp 98.8°F | Resp 18 | Ht 64.0 in | Wt 124.0 lb

## 2015-07-14 DIAGNOSIS — G249 Dystonia, unspecified: Secondary | ICD-10-CM

## 2015-07-14 DIAGNOSIS — Z79899 Other long term (current) drug therapy: Secondary | ICD-10-CM | POA: Diagnosis not present

## 2015-07-14 DIAGNOSIS — G43909 Migraine, unspecified, not intractable, without status migrainosus: Secondary | ICD-10-CM | POA: Insufficient documentation

## 2015-07-14 DIAGNOSIS — E063 Autoimmune thyroiditis: Secondary | ICD-10-CM | POA: Insufficient documentation

## 2015-07-14 DIAGNOSIS — G248 Other dystonia: Secondary | ICD-10-CM | POA: Insufficient documentation

## 2015-07-14 DIAGNOSIS — E7800 Pure hypercholesterolemia, unspecified: Secondary | ICD-10-CM | POA: Insufficient documentation

## 2015-07-14 DIAGNOSIS — R894 Abnormal immunological findings in specimens from other organs, systems and tissues: Secondary | ICD-10-CM | POA: Diagnosis not present

## 2015-07-14 NOTE — Telephone Encounter (Signed)
Patient stated at check out that she has not been a smoker for a year and a half now and her AVS still says she is a smoker and wants this to be updated. Thank you.

## 2015-07-14 NOTE — Progress Notes (Signed)
Patient ID: Megan Hodges, female   DOB: [birth year], 44 y.o.   MRN: [id number]   Megan Hodges, is a 44 y.o. female  UPJ:[id number]  [id number]  DOB - 23-May-1971  Chief Complaint  Patient presents with  . Follow-up        Subjective:   Megan Hodges is a 44 y.o. female with history of generalized dystonia, Hashimoto's thyroiditis, chronic migraine headache, arthritis and dyslipidemia here today for a follow up visit. Patient complains of pain in her stomach since last October. Patient states after eating, a large "knot" forms in her stomach and she is bloated. Patient will not eat until symptoms subside which generally takes all day. Patient would like to make sure birth control pills are on her medication med list. Patient has No headache, No chest pain, No new weakness tingling or numbness, No Cough - SOB. She will like to be tested for Celiac disease as suggested by her specialist physician.  Problem  Abnormal Celiac Antibody Panel    ALLERGIES: Allergies  Allergen Reactions  . Epinephrine   . Lidocaine     PAST MEDICAL HISTORY: Past Medical History  Diagnosis Date  . Generalized dystonia   . Hypercholesteremia   . Hashimoto's disease   . Migraines     MEDICATIONS AT HOME: Prior to Admission medications   Medication Sig Start Date End Date Taking? Authorizing Provider  baclofen (LIORESAL) 20 MG tablet Take 20 mg by mouth 4 (four) times daily.   Yes Historical Provider, MD  carbidopa-levodopa-entacapone (STALEVO) 12.5-50-200 MG per tablet Take 1 tablet by mouth 4 (four) times daily.   Yes Historical Provider, MD  dantrolene (DANTRIUM) 50 MG capsule Take 50 mg by mouth 4 (four) times daily.   Yes Historical Provider, MD  diazepam (VALIUM) 10 MG tablet Take 10 mg by mouth every 6 (six) hours as needed for anxiety.   Yes Historical Provider, MD  fluconazole (DIFLUCAN) 200 MG tablet Take 1 tablet (200 mg total) by mouth once. Repeat in one week 02/03/15  Yes Tresa Garter,  MD  HYDROmorphone (DILAUDID) 2 MG tablet Take by mouth daily.    Yes Historical Provider, MD  HYDROmorphone HCl (EXALGO) 12 MG T24A SR tablet Take 16 mg by mouth daily.   Yes Historical Provider, MD  Levonorgestrel-Ethinyl Estradiol (CAMRESE) 0.15-0.03 &0.01 MG tablet Take 1 tablet by mouth daily. 05/24/15  Yes Tresa Garter, MD  levothyroxine (SYNTHROID, LEVOTHROID) 25 MCG tablet TAKE 2 TABLETS (50 MCG TOTAL) BY MOUTH DAILY BEFORE BREAKFAST. 03/18/15  Yes Philemon Kingdom, MD  levothyroxine (SYNTHROID, LEVOTHROID) 25 MCG tablet TAKE 2 TABLETS (50 MCG TOTAL) BY MOUTH DAILY BEFORE BREAKFAST. 03/31/15  Yes Philemon Kingdom, MD  Linaclotide (LINZESS PO) Take by mouth.   Yes Historical Provider, MD  ondansetron (ZOFRAN) 4 MG tablet Take 1 tablet (4 mg total) by mouth every 8 (eight) hours as needed for nausea or vomiting. 11/30/13  Yes Tresa Garter, MD  pramipexole (MIRAPEX) 0.5 MG tablet Take 0.5 mg by mouth 3 (three) times daily.   Yes Historical Provider, MD  PredniSONE 5 MG KIT 12 day dose pack po 06/29/14  Yes Gregor Hams, MD  Red Yeast Rice Extract (RED YEAST RICE PO) Take by mouth daily.   Yes Historical Provider, MD  rizatriptan (MAXALT) 10 MG tablet TAKE 1 TABLET (10 MG TOTAL) BY MOUTH AS NEEDED FOR MIGRAINE. MAY REPEAT IN 2 HOURS IF NEEDED 05/25/14  Yes Tresa Garter, MD     Objective:  Filed Vitals:   07/14/15 1441  BP: 132/67  Pulse: 71  Temp: 98.8 F (37.1 C)  TempSrc: Oral  Resp: 18  Height: 5' 4"  (1.626 m)  Weight: 124 lb (56.246 kg)  SpO2: 97%    Exam General appearance : Awake, alert, not in any distress. Speech Clear. Not toxic looking HEENT: Atraumatic and Normocephalic, pupils equally reactive to light and accomodation Neck: supple, no JVD. No cervical lymphadenopathy.  Chest:Good air entry bilaterally, no added sounds  CVS: S1 S2 regular, no murmurs.  Abdomen: Bowel sounds present, Non tender and not distended with no gaurding, rigidity or  rebound. Extremities: B/L Lower Ext shows no edema, both legs are warm to touch Neurology: Awake alert, and oriented X 3, CN II-XII intact, Non focal  Data Review No results found for: HGBA1C  Assessment & Plan   1. Generalized dystonia  - ANA  2. Abnormal celiac antibody panel  - Endomysial ab scrn + titer, IgA - Tissue Transglutaminase, IGG - Tissue Transglutaminase, IGA - Celiac panel - ANA  Continue follow up with Neurologist, pain clinic, endocrinologist and other specialists as scheduled.  Patient have been counseled extensively about nutrition and exercise  Return in about 6 months (around 01/12/2016) for Follow up Pain and comorbidities, Routine Follow Up.  The patient was given clear instructions to go to ER or return to medical center if symptoms don't improve, worsen or new problems develop. The patient verbalized understanding. The patient was told to call to get lab results if they haven't heard anything in the next week.   This note has been created with Surveyor, quantity. Any transcriptional errors are unintentional.    [name], MD, [place], [place], [place], [place] and [place], [place]   07/14/2015, 3:35 PM

## 2015-07-14 NOTE — Progress Notes (Signed)
Patient here for Autoimmune Panel.  Patient complains of pain in her stomach from last October. Patient states after eating, a large "knot" forms in her stomach and she is bloated. Patient will not eat until symptoms subside which generally takes all day.  Patient would like to make sure birth control pills are on medication med list.

## 2015-07-15 LAB — ENDOMYSIAL AB IGA RFLX TITER: Endomysial Screen: NEGATIVE

## 2015-07-15 LAB — ANA: Anti Nuclear Antibody(ANA): NEGATIVE

## 2015-07-16 LAB — TISSUE TRANSGLUTAMINASE, IGG: Tissue Transglut Ab: 1 U/mL (ref <6)

## 2015-07-16 LAB — GLIA (IGA/G) + TTG IGA
GLIADIN IGG: 1 U (ref <20)
Gliadin IgA: 3 Units (ref <20)
Tissue Transglutaminase Ab, IgA: 1 U/mL (ref <4)

## 2015-07-30 ENCOUNTER — Other Ambulatory Visit: Payer: Self-pay | Admitting: Internal Medicine

## 2015-08-12 NOTE — Telephone Encounter (Signed)
Patient called and requested a med refill for Levonorgestrel-Ethinyl Estradiol (CAMRESE) 0.15-0.03 &0.01 MG tablet. Patient stated that she does not take the period pills and just takes them straight through, patient stated that she doesn't have a period and takes the pills continuously which makes her be 3 weeks early for the medication.   Patient will run out of medication on Sunday.

## 2015-08-14 ENCOUNTER — Encounter: Payer: Self-pay | Admitting: Internal Medicine

## 2015-08-16 NOTE — Telephone Encounter (Signed)
Pt. Called requesting a med refill for Levonorgestrel-Ethinyl Estradiol (CAMRESE) 0.15-0.03 &0.01 MG tablet. Pt. Stated she is out of this medication. Please f/u with pt.

## 2015-08-16 NOTE — Telephone Encounter (Signed)
Patient called upset because she needs birth control refilled.  Patient explains she has been trying to have medication refilled since 07/30/15.  Nurse sent refill to pharmacy for patient.

## 2015-08-16 NOTE — Telephone Encounter (Signed)
Pt calling once more about this request. Patient is very upset that this is "happening every time". Thank you, Fonda Kinder, ASA

## 2015-09-11 ENCOUNTER — Other Ambulatory Visit: Payer: Self-pay | Admitting: Internal Medicine

## 2015-09-14 ENCOUNTER — Telehealth: Payer: Self-pay | Admitting: *Deleted

## 2015-09-14 NOTE — Telephone Encounter (Signed)
-----   Message from Tresa Garter, MD sent at 07/22/2015  6:40 PM EDT ----- Please inform patient that laboratory test results and celiac panel were normal and negative for celiac disease.

## 2015-09-14 NOTE — Telephone Encounter (Signed)
Patient verified DOB Patient was made aware of her test results being normal and negative for celiac disease. Patient states she reviews her results on [facility]. Patient continues to complain of gastro concerns.  Patient states she has acid reflux and stomach pains which are severe and inhibit her from eating, drinking and taking medications. Patient states she received a DNA test back which stated her body does not metabolize medication well. Also other diseases related to her blood and family. Patient states she sees specialist but no one will put in a referral for Gastroenterologist. Patient states she is in need of this referral. She understands lab results may show normal results but she is still experiencing the discomfort and needs a solution. Medical Assistant advised patient of her speaking with the doctor to received the permission to place the referral or what next step the patient should take.

## 2015-10-07 DIAGNOSIS — G245 Blepharospasm: Secondary | ICD-10-CM | POA: Diagnosis not present

## 2015-10-07 DIAGNOSIS — G248 Other dystonia: Secondary | ICD-10-CM | POA: Diagnosis not present

## 2015-10-07 DIAGNOSIS — G243 Spasmodic torticollis: Secondary | ICD-10-CM | POA: Diagnosis not present

## 2015-10-07 DIAGNOSIS — M2550 Pain in unspecified joint: Secondary | ICD-10-CM | POA: Diagnosis not present

## 2015-10-07 NOTE — Telephone Encounter (Signed)
error 

## 2015-10-13 DIAGNOSIS — M25512 Pain in left shoulder: Secondary | ICD-10-CM | POA: Diagnosis not present

## 2015-10-13 DIAGNOSIS — Z79899 Other long term (current) drug therapy: Secondary | ICD-10-CM | POA: Diagnosis not present

## 2015-10-13 DIAGNOSIS — G241 Genetic torsion dystonia: Secondary | ICD-10-CM | POA: Diagnosis not present

## 2015-10-13 DIAGNOSIS — M25511 Pain in right shoulder: Secondary | ICD-10-CM | POA: Diagnosis not present

## 2015-10-13 DIAGNOSIS — M542 Cervicalgia: Secondary | ICD-10-CM | POA: Diagnosis not present

## 2015-10-14 ENCOUNTER — Encounter: Payer: Self-pay | Admitting: Gastroenterology

## 2015-10-24 ENCOUNTER — Encounter: Payer: Self-pay | Admitting: Gastroenterology

## 2015-10-24 ENCOUNTER — Other Ambulatory Visit (INDEPENDENT_AMBULATORY_CARE_PROVIDER_SITE_OTHER): Payer: Medicare Other

## 2015-10-24 ENCOUNTER — Ambulatory Visit (INDEPENDENT_AMBULATORY_CARE_PROVIDER_SITE_OTHER): Payer: Medicare Other | Admitting: Gastroenterology

## 2015-10-24 VITALS — BP 102/68 | HR 100 | Ht 64.75 in | Wt 127.2 lb

## 2015-10-24 DIAGNOSIS — R1013 Epigastric pain: Secondary | ICD-10-CM

## 2015-10-24 DIAGNOSIS — K5909 Other constipation: Secondary | ICD-10-CM

## 2015-10-24 LAB — COMPREHENSIVE METABOLIC PANEL
ALBUMIN: 4.5 g/dL (ref 3.5–5.2)
ALT: 7 U/L (ref 0–35)
AST: 15 U/L (ref 0–37)
Alkaline Phosphatase: 47 U/L (ref 39–117)
BUN: 9 mg/dL (ref 6–23)
CHLORIDE: 104 meq/L (ref 96–112)
CO2: 23 meq/L (ref 19–32)
CREATININE: 0.89 mg/dL (ref 0.40–1.20)
Calcium: 9.3 mg/dL (ref 8.4–10.5)
GFR: 73 mL/min (ref >60.00)
Glucose, Bld: 90 mg/dL (ref 70–99)
POTASSIUM: 4.3 meq/L (ref 3.5–5.1)
SODIUM: 140 meq/L (ref 135–145)
Total Bilirubin: 0.4 mg/dL (ref 0.2–1.2)
Total Protein: 7.7 g/dL (ref 6.0–8.3)

## 2015-10-24 LAB — CBC WITH DIFFERENTIAL/PLATELET
BASOS PCT: 0.6 % (ref 0.0–3.0)
Basophils Absolute: 0.1 10*3/uL (ref 0.0–0.1)
EOS PCT: 1.9 % (ref 0.0–5.0)
Eosinophils Absolute: 0.2 10*3/uL (ref 0.0–0.7)
HEMATOCRIT: 43.1 % (ref 36.0–46.0)
HEMOGLOBIN: 14.1 g/dL (ref 12.0–15.0)
LYMPHS PCT: 23.5 % (ref 12.0–46.0)
Lymphs Abs: 2.1 10*3/uL (ref 0.7–4.0)
MCHC: 32.8 g/dL (ref 30.0–36.0)
MCV: 95.6 fl (ref 78.0–100.0)
MONO ABS: 0.5 10*3/uL (ref 0.1–1.0)
Monocytes Relative: 6.2 % (ref 3.0–12.0)
NEUTROS ABS: 6 10*3/uL (ref 1.4–7.7)
Neutrophils Relative %: 67.8 % (ref 43.0–77.0)
PLATELETS: 234 10*3/uL (ref 150.0–400.0)
RBC: 4.51 Mil/uL (ref 3.87–5.11)
RDW: 13.8 % (ref 11.5–15.5)
WBC: 8.9 10*3/uL (ref 4.0–10.5)

## 2015-10-24 MED ORDER — NA SULFATE-K SULFATE-MG SULF 17.5-3.13-1.6 GM/177ML PO SOLN: 1.0000 | Freq: Once | ORAL | Status: AC

## 2015-10-24 NOTE — Progress Notes (Signed)
HPI: This is a  pleasant 45 year old woman who was referred to me by Tresa Garter, MD  to evaluate  abdominal pain, nausea, constipation .    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    Gets botox like injections every 3 months  She gets rigid spasms throughout her body.  Neck, eyes, arms, legs,   Dr. Haynes Kerns is her neurologist Dr. Rowe Pavy in [place], [place].   Has been helping her for many years.  Many many tests done to prove this.  She has hashimotos,   A "23 and me" test was done and this listed possible ulcerative colitis, Crohn's disease given her genetic profile. I have never heard of this test before. She had a large print out.  She has abdominal pains for 1-2 years.  The pain is in mid epigastrium.  She has a painful bulge in her mid epigastrium after eating.  Also lower or central abd bulging, bloating.  After eating rigid pain, rock like in mid epigastrium. She is nauseas pretty much all the time.    She takes linzess daily for chronic constipation.  She has BMs during sleep at times.  She has a LLQ "kink" and has to massage stool around a corner.  Usually has scibollous stools.  Has never noticed blood in her BMs  No Fh of colon cancer.  Sees pain management in [place];  She normally takes hydromorphone ER, 16 twice daily.  She takes dilaudid instant release 2mg  (PRN only)  She used to take 4 advil daily.  She stopped in the past month or so.   Review of systems: Pertinent positive and negative review of systems were noted in the above HPI section. Complete review of systems was performed and was otherwise normal.   Past Medical History  Diagnosis Date  . Generalized dystonia   . Hypercholesteremia   . Hashimoto's disease   . Migraines   . Anxiety   . RA (rheumatoid arthritis) (HCC)     juvenile  . Osteoarthritis   . Fibromyalgia   . GERD  (gastroesophageal reflux disease)   . Depression   . Blepharospasm     Past Surgical History  Procedure Laterality Date  . Cesarean section  1999  . Spine surgery  2005    L5-S1    Current Outpatient Prescriptions  Medication Sig Dispense Refill  . AbobotulinumtoxinA (DYSPORT) 500 units SOLR injection Inject 1,000 Units into the muscle every 3 (three) months.    . baclofen (LIORESAL) 20 MG tablet Take 20 mg by mouth 4 (four) times daily.    Marland Kitchen CAMRESE 0.15-0.03 &0.01 MG tablet TAKE 1 TABLET BY MOUTH DAILY. 91 tablet 0  . carbidopa-levodopa-entacapone (STALEVO) 12.5-50-200 MG per tablet Take 1 tablet by mouth 4 (four) times daily.    . dantrolene (DANTRIUM) 50 MG capsule Take 50 mg by mouth 4 (four) times daily.    . diazepam (VALIUM) 10 MG tablet Take 10 mg by mouth every 6 (six) hours as needed for anxiety.    Marland Kitchen HYDROmorphone (DILAUDID) 2 MG tablet Take 2 mg by mouth every 12 (twelve) hours as needed.     Marland Kitchen HYDROmorphone HCl 16 MG T24A Take 1 tablet by mouth every 12 (twelve) hours.    Marland Kitchen levothyroxine (SYNTHROID, LEVOTHROID) 25 MCG tablet TAKE 2 TABLETS (50 MCG TOTAL) BY MOUTH DAILY BEFORE BREAKFAST. 60 tablet 2  . Linaclotide (LINZESS PO) Take by  mouth.    . ondansetron (ZOFRAN) 4 MG tablet Take 1 tablet (4 mg total) by mouth every 8 (eight) hours as needed for nausea or vomiting. 30 tablet 3  . Red Yeast Rice Extract (RED YEAST RICE PO) Take by mouth daily.    . rizatriptan (MAXALT) 10 MG tablet TAKE 1 TABLET (10 MG TOTAL) BY MOUTH AS NEEDED FOR MIGRAINE. MAY REPEAT IN 2 HOURS IF NEEDED 10 tablet 1   No current facility-administered medications for this visit.    Allergies as of 10/24/2015 - Review Complete 10/24/2015  Allergen Reaction Noted  . Epinephrine  08/27/2013  . Lidocaine  08/27/2013    Family History  Problem Relation Age of Onset  . Dystonia Mother   . Breast cancer Mother   . Bone cancer Maternal Grandmother   . Breast cancer Paternal Aunt   . Colon polyps  Mother   . Diabetes Maternal Grandfather   . Heart disease Maternal Grandfather   . Heart disease Paternal Grandmother     Social History   Social History  . Marital Status: Unknown    Spouse Name: N/A  . Number of Children: 1  . Years of Education: N/A   Occupational History  . disabled    Social History Main Topics  . Smoking status: Former Smoker -- 0.00 packs/day    Types: E-cigarettes    Quit date: 02/04/2014  . Smokeless tobacco: Never Used     Comment: no ciggarettes since April - now vaping   . Alcohol Use: No  . Drug Use: No  . Sexual Activity: Not Currently   Other Topics Concern  . Not on file   Social History Narrative     Physical Exam: BP 102/68 mmHg  Pulse 100  Ht 5' 4.75" (1.645 m)  Wt 127 lb 4 oz (57.72 kg)  BMI 21.33 kg/m2 Constitutional: generally well-appearing Psychiatric: alert and oriented x3 Eyes: extraocular movements intact Mouth: oral pharynx moist, no lesions Neck: supple no lymphadenopathy Cardiovascular: heart regular rate and rhythm Lungs: clear to auscultation bilaterally Abdomen: soft, nontender, nondistended, no obvious ascites, no peritoneal signs, normal bowel sounds Extremities: no lower extremity edema bilaterally Skin: no lesions on visible extremities   Assessment and plan: 45 y.o. female with  generalized dystonia, chronic epigastric and lower abdominal pain, chronic constipation, chronic nausea  I suspect her GI symptoms are related to her underlying generalized dystonia or medicines which she is taking because of the dystonia. Nausea vomiting is a common side effect of at least 2 or 3 of her medicines. I wonder if some of her abdominal pains which she described as rock like hardness is related to her dystonia as well because she describes her dystonia muscles as being very rock hard and rigid. She was taking quite a lot of NSAIDs until just the past month or so and so perhaps she has underlying peptic ulcer disease. She  is on chronic narcotic pain medicines which can cause nausea, vomiting, gastric slowing, constipation. I recommended we begin her workup with a CT scan abdomen pelvis with IV and oral contrast. She will also have CBC and complete about profile drawn today. We will begin arranging colonoscopy as well as upper endoscopy for abdominal pains nausea, constipation as well.   Owens Loffler, MD [facility] 10/24/2015, 11:27 AM  Cc: Tresa Garter, MD

## 2015-10-24 NOTE — Patient Instructions (Addendum)
You will be set up for a CT scan of abdomen and pelvis with IV and oral contrast for epigastric and lower abdominal pains.  You have been scheduled for a CT scan of the abdomen and pelvis at [facility] ([address]---this is in the same building as Press [facility]).   You are scheduled on 10/26/15 at 1030 am. You should arrive 15 minutes prior to your appointment time for registration. Please follow the written instructions below on the day of your exam:  WARNING: IF YOU ARE ALLERGIC TO IODINE/X-RAY DYE, PLEASE NOTIFY RADIOLOGY IMMEDIATELY AT [phone]! YOU WILL BE GIVEN A 13 HOUR PREMEDICATION PREP.  1) Do not eat or drink anything after 630 am (4 hours prior to your test) 2) You have been given 2 bottles of oral contrast to drink. The solution may taste better if refrigerated, but do NOT add ice or any other liquid to this solution. Shake well before drinking.    Drink 1 bottle of contrast @ 830 am (2 hours prior to your exam)  Drink 1 bottle of contrast @ 930 am (1 hour prior to your exam)  You may take any medications as prescribed with a small amount of water except for the following: Metformin, Glucophage, Glucovance, Avandamet, Riomet, Fortamet, Actoplus Met, Janumet, Glumetza or Metaglip. The above medications must be held the day of the exam AND 48 hours after the exam.  The purpose of you drinking the oral contrast is to aid in the visualization of your intestinal tract. The contrast solution may cause some diarrhea. Before your exam is started, you will be given a small amount of fluid to drink. Depending on your individual set of symptoms, you may also receive an intravenous injection of x-ray contrast/dye. Plan on being at [facility] for 30 minutes or longer, depending on the type of exam you are having performed.  This test typically takes 30-45 minutes to complete.  If you have any questions regarding your exam or if you need to reschedule, you may call  the CT department at [phone] between the hours of 8:00 am and 5:00 pm, Monday-Friday.  ________________________________________________________________________  Dennis Bast will be set up for a colonoscopy (at [facility]). You will be set up for an upper endoscopy (at [facility]). You will have labs checked today in the basement lab.  Please head down after you check out with the front desk  (cbc, cmet)

## 2015-10-26 ENCOUNTER — Ambulatory Visit (INDEPENDENT_AMBULATORY_CARE_PROVIDER_SITE_OTHER)
Admission: RE | Admit: 2015-10-26 | Discharge: 2015-10-26 | Disposition: A | Discharge status: Home / Self Care | Payer: Medicare Other | Source: Ambulatory Visit | Attending: Gastroenterology | Admitting: Gastroenterology

## 2015-10-26 DIAGNOSIS — R109 Unspecified abdominal pain: Secondary | ICD-10-CM | POA: Diagnosis not present

## 2015-10-26 DIAGNOSIS — R1013 Epigastric pain: Secondary | ICD-10-CM

## 2015-10-26 MED ORDER — IOHEXOL 300 MG/ML  SOLN
100.0000 mL | Freq: Once | INTRAMUSCULAR | Status: AC | PRN
  Administered 2015-10-26: 100 mL via INTRAVENOUS

## 2015-10-29 ENCOUNTER — Other Ambulatory Visit: Payer: Self-pay | Admitting: Internal Medicine

## 2015-11-07 ENCOUNTER — Encounter (HOSPITAL_COMMUNITY): Payer: Self-pay | Admitting: *Deleted

## 2015-11-16 ENCOUNTER — Telehealth: Payer: Self-pay | Admitting: Gastroenterology

## 2015-11-16 NOTE — Anesthesia Preprocedure Evaluation (Addendum)
Anesthesia Evaluation  Patient identified by MRN, date of birth, ID band Patient awake    Reviewed: Allergy & Precautions, H&P , NPO status , Patient's Chart, lab work & pertinent test results  Airway Mallampati: II  TM Distance: >3 FB Neck ROM: full    Dental no notable dental hx. (+) Dental Advisory Given, Teeth Intact   Pulmonary neg pulmonary ROS, former smoker,    Pulmonary exam normal breath sounds clear to auscultation       Cardiovascular Exercise Tolerance: Good negative cardio ROS Normal cardiovascular exam Rhythm:regular Rate:Normal     Neuro/Psych  Headaches, Anxiety Depression Generalized dystonia  Neuromuscular disease negative neurological ROS  negative psych ROS   GI/Hepatic negative GI ROS, Neg liver ROS, GERD  Medicated and Controlled,  Endo/Other  negative endocrine ROSHashimoto's thyroiditis  Renal/GU negative Renal ROS  negative genitourinary   Musculoskeletal  (+) Arthritis , Rheumatoid disorders,  Fibromyalgia -  Abdominal   Peds  Hematology negative hematology ROS (+)   Anesthesia Other Findings   Reproductive/Obstetrics negative OB ROS                            Anesthesia Physical Anesthesia Plan  ASA: III  Anesthesia Plan: MAC   Post-op Pain Management:    Induction:   Airway Management Planned: Natural Airway and Nasal Cannula  Additional Equipment:   Intra-op Plan:   Post-operative Plan:   Informed Consent: I have reviewed the patients History and Physical, chart, labs and discussed the procedure including the risks, benefits and alternatives for the proposed anesthesia with the patient or authorized representative who has indicated his/her understanding and acceptance.   Dental Advisory Given  Plan Discussed with: CRNA  Anesthesia Plan Comments:        Anesthesia Quick Evaluation

## 2015-11-16 NOTE — Telephone Encounter (Signed)
Vomited entire first dose of Suprep 20 min after taking. She wants another bowel prep. Gave instructions on Miralax/Gatorade prep.

## 2015-11-17 ENCOUNTER — Encounter (HOSPITAL_COMMUNITY): Payer: Self-pay | Admitting: Registered Nurse

## 2015-11-17 ENCOUNTER — Ambulatory Visit (HOSPITAL_COMMUNITY): Payer: Medicare Other | Admitting: Anesthesiology

## 2015-11-17 ENCOUNTER — Encounter (HOSPITAL_COMMUNITY)
Admission: RE | Disposition: A | Discharge status: Home / Self Care | Payer: Self-pay | Source: Ambulatory Visit | Attending: Gastroenterology

## 2015-11-17 ENCOUNTER — Ambulatory Visit (HOSPITAL_COMMUNITY)
Admission: RE | Admit: 2015-11-17 | Discharge: 2015-11-17 | Disposition: A | Discharge status: Home / Self Care | Payer: Medicare Other | Source: Ambulatory Visit | Attending: Gastroenterology | Admitting: Gastroenterology

## 2015-11-17 DIAGNOSIS — M1990 Unspecified osteoarthritis, unspecified site: Secondary | ICD-10-CM | POA: Diagnosis not present

## 2015-11-17 DIAGNOSIS — K5909 Other constipation: Secondary | ICD-10-CM | POA: Diagnosis not present

## 2015-11-17 DIAGNOSIS — Z9981 Dependence on supplemental oxygen: Secondary | ICD-10-CM | POA: Diagnosis not present

## 2015-11-17 DIAGNOSIS — Z87891 Personal history of nicotine dependence: Secondary | ICD-10-CM | POA: Insufficient documentation

## 2015-11-17 DIAGNOSIS — E7800 Pure hypercholesterolemia, unspecified: Secondary | ICD-10-CM | POA: Insufficient documentation

## 2015-11-17 DIAGNOSIS — M069 Rheumatoid arthritis, unspecified: Secondary | ICD-10-CM | POA: Insufficient documentation

## 2015-11-17 DIAGNOSIS — G249 Dystonia, unspecified: Secondary | ICD-10-CM | POA: Insufficient documentation

## 2015-11-17 DIAGNOSIS — M797 Fibromyalgia: Secondary | ICD-10-CM | POA: Insufficient documentation

## 2015-11-17 DIAGNOSIS — E46 Unspecified protein-calorie malnutrition: Secondary | ICD-10-CM | POA: Diagnosis not present

## 2015-11-17 DIAGNOSIS — E063 Autoimmune thyroiditis: Secondary | ICD-10-CM | POA: Diagnosis not present

## 2015-11-17 DIAGNOSIS — K5900 Constipation, unspecified: Secondary | ICD-10-CM

## 2015-11-17 DIAGNOSIS — Z79899 Other long term (current) drug therapy: Secondary | ICD-10-CM | POA: Diagnosis not present

## 2015-11-17 DIAGNOSIS — R110 Nausea: Secondary | ICD-10-CM | POA: Insufficient documentation

## 2015-11-17 DIAGNOSIS — K2950 Unspecified chronic gastritis without bleeding: Secondary | ICD-10-CM | POA: Diagnosis not present

## 2015-11-17 DIAGNOSIS — Z793 Long term (current) use of hormonal contraceptives: Secondary | ICD-10-CM | POA: Diagnosis not present

## 2015-11-17 DIAGNOSIS — K219 Gastro-esophageal reflux disease without esophagitis: Secondary | ICD-10-CM | POA: Diagnosis not present

## 2015-11-17 DIAGNOSIS — R1013 Epigastric pain: Secondary | ICD-10-CM | POA: Diagnosis not present

## 2015-11-17 DIAGNOSIS — G709 Myoneural disorder, unspecified: Secondary | ICD-10-CM | POA: Diagnosis not present

## 2015-11-17 DIAGNOSIS — R109 Unspecified abdominal pain: Secondary | ICD-10-CM | POA: Insufficient documentation

## 2015-11-17 DIAGNOSIS — K2970 Gastritis, unspecified, without bleeding: Secondary | ICD-10-CM | POA: Diagnosis not present

## 2015-11-17 HISTORY — PX: ESOPHAGOGASTRODUODENOSCOPY (EGD) WITH PROPOFOL: SHX5813

## 2015-11-17 HISTORY — PX: COLONOSCOPY WITH PROPOFOL: SHX5780

## 2015-11-17 HISTORY — DX: Adverse effect of unspecified anesthetic, initial encounter: T41.45XA

## 2015-11-17 HISTORY — DX: Other complications of anesthesia, initial encounter: T88.59XA

## 2015-11-17 SURGERY — COLONOSCOPY WITH PROPOFOL
Anesthesia: Monitor Anesthesia Care

## 2015-11-17 MED ORDER — PROPOFOL 10 MG/ML IV BOLUS
INTRAVENOUS | Status: DC | PRN
  Administered 2015-11-17 (×3): 20 mg via INTRAVENOUS

## 2015-11-17 MED ORDER — EPHEDRINE SULFATE 50 MG/ML IJ SOLN
INTRAMUSCULAR | Status: AC
  Filled 2015-11-17: qty 1

## 2015-11-17 MED ORDER — FENTANYL CITRATE (PF) 100 MCG/2ML IJ SOLN: 25.0000 ug | INTRAMUSCULAR | Status: DC | PRN

## 2015-11-17 MED ORDER — SODIUM CHLORIDE 0.9 % IV SOLN: INTRAVENOUS | Status: DC

## 2015-11-17 MED ORDER — PROPOFOL 10 MG/ML IV BOLUS
INTRAVENOUS | Status: AC
  Filled 2015-11-17: qty 40

## 2015-11-17 MED ORDER — PROPOFOL 500 MG/50ML IV EMUL
INTRAVENOUS | Status: DC | PRN
  Administered 2015-11-17: 130 ug/kg/min via INTRAVENOUS

## 2015-11-17 MED ORDER — LACTATED RINGERS IV SOLN
INTRAVENOUS | Status: DC
  Administered 2015-11-17: 1000 mL via INTRAVENOUS

## 2015-11-17 MED ORDER — LIDOCAINE HCL (CARDIAC) 20 MG/ML IV SOLN
INTRAVENOUS | Status: AC
  Filled 2015-11-17: qty 5

## 2015-11-17 MED ORDER — SODIUM CHLORIDE 0.9 % IJ SOLN
INTRAMUSCULAR | Status: AC
  Filled 2015-11-17: qty 10

## 2015-11-17 SURGICAL SUPPLY — 24 items

## 2015-11-17 NOTE — Transfer of Care (Signed)
Immediate Anesthesia Transfer of Care Note  Patient: Megan Hodges  Procedure(s) Performed: Procedure(s): COLONOSCOPY WITH PROPOFOL (N/A) ESOPHAGOGASTRODUODENOSCOPY (EGD) WITH PROPOFOL (N/A)  Patient Location: PACU and Endoscopy Unit  Anesthesia Type:MAC  Level of Consciousness: awake, alert , oriented and patient cooperative  Airway & Oxygen Therapy: Patient Spontanous Breathing and Patient connected to face mask oxygen  Post-op Assessment: Report given to RN, Post -op Vital signs reviewed and stable and Patient moving all extremities  Post vital signs: Reviewed and stable  Last Vitals:  Filed Vitals:   11/17/15 0655  BP: 126/78  Pulse: 77  Temp: 37.1 C  Resp: 13    Complications: No apparent anesthesia complications

## 2015-11-17 NOTE — Interval H&P Note (Signed)
History and Physical Interval Note:  11/17/2015 7:19 AM  [name]  has presented today for surgery, with the diagnosis of abd pain,epigastric pain  The various methods of treatment have been discussed with the patient and family. After consideration of risks, benefits and other options for treatment, the patient has consented to  Procedure(s): COLONOSCOPY WITH PROPOFOL (N/A) ESOPHAGOGASTRODUODENOSCOPY (EGD) WITH PROPOFOL (N/A) as a surgical intervention .  The patient's history has been reviewed, patient examined, no change in status, stable for surgery.  I have reviewed the patient's chart and labs.  Questions were answered to the patient's satisfaction.     Megan Hodges

## 2015-11-17 NOTE — Anesthesia Postprocedure Evaluation (Signed)
Anesthesia Post Note  Patient: Megan Hodges  Procedure(s) Performed: Procedure(s) (LRB): COLONOSCOPY WITH PROPOFOL (N/A) ESOPHAGOGASTRODUODENOSCOPY (EGD) WITH PROPOFOL (N/A)  Patient location during evaluation: PACU Anesthesia Type: MAC Level of consciousness: awake and alert Pain management: pain level controlled Vital Signs Assessment: post-procedure vital signs reviewed and stable Respiratory status: spontaneous breathing, nonlabored ventilation, respiratory function stable and patient connected to nasal cannula oxygen Cardiovascular status: blood pressure returned to baseline and stable Postop Assessment: no signs of nausea or vomiting Anesthetic complications: no    Last Vitals:  Filed Vitals:   11/17/15 0825 11/17/15 0830  BP:  127/60  Pulse: 77 74  Temp:    Resp: 14 15    Last Pain:  Filed Vitals:   11/17/15 0836  PainSc: 7                  Kaimana Lurz L

## 2015-11-17 NOTE — H&P (View-Only) (Signed)
HPI: This is a  pleasant 45 year old woman who was referred to me by Tresa Garter, MD  to evaluate  abdominal pain, nausea, constipation .    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    Gets botox like injections every 3 months  She gets rigid spasms throughout her body.  Neck, eyes, arms, legs,   Dr. Haynes Kerns is her neurologist Dr. Rowe Pavy in [place], [place].   Has been helping her for many years.  Many many tests done to prove this.  She has hashimotos,   A "23 and me" test was done and this listed possible ulcerative colitis, Crohn's disease given her genetic profile. I have never heard of this test before. She had a large print out.  She has abdominal pains for 1-2 years.  The pain is in mid epigastrium.  She has a painful bulge in her mid epigastrium after eating.  Also lower or central abd bulging, bloating.  After eating rigid pain, rock like in mid epigastrium. She is nauseas pretty much all the time.    She takes linzess daily for chronic constipation.  She has BMs during sleep at times.  She has a LLQ "kink" and has to massage stool around a corner.  Usually has scibollous stools.  Has never noticed blood in her BMs  No Fh of colon cancer.  Sees pain management in [place];  She normally takes hydromorphone ER, 16 twice daily.  She takes dilaudid instant release 2mg  (PRN only)  She used to take 4 advil daily.  She stopped in the past month or so.   Review of systems: Pertinent positive and negative review of systems were noted in the above HPI section. Complete review of systems was performed and was otherwise normal.   Past Medical History  Diagnosis Date  . Generalized dystonia   . Hypercholesteremia   . Hashimoto's disease   . Migraines   . Anxiety   . RA (rheumatoid arthritis) (HCC)     juvenile  . Osteoarthritis   . Fibromyalgia   . GERD  (gastroesophageal reflux disease)   . Depression   . Blepharospasm     Past Surgical History  Procedure Laterality Date  . Cesarean section  1999  . Spine surgery  2005    L5-S1    Current Outpatient Prescriptions  Medication Sig Dispense Refill  . AbobotulinumtoxinA (DYSPORT) 500 units SOLR injection Inject 1,000 Units into the muscle every 3 (three) months.    . baclofen (LIORESAL) 20 MG tablet Take 20 mg by mouth 4 (four) times daily.    Marland Kitchen CAMRESE 0.15-0.03 &0.01 MG tablet TAKE 1 TABLET BY MOUTH DAILY. 91 tablet 0  . carbidopa-levodopa-entacapone (STALEVO) 12.5-50-200 MG per tablet Take 1 tablet by mouth 4 (four) times daily.    . dantrolene (DANTRIUM) 50 MG capsule Take 50 mg by mouth 4 (four) times daily.    . diazepam (VALIUM) 10 MG tablet Take 10 mg by mouth every 6 (six) hours as needed for anxiety.    Marland Kitchen HYDROmorphone (DILAUDID) 2 MG tablet Take 2 mg by mouth every 12 (twelve) hours as needed.     Marland Kitchen HYDROmorphone HCl 16 MG T24A Take 1 tablet by mouth every 12 (twelve) hours.    Marland Kitchen levothyroxine (SYNTHROID, LEVOTHROID) 25 MCG tablet TAKE 2 TABLETS (50 MCG TOTAL) BY MOUTH DAILY BEFORE BREAKFAST. 60 tablet 2  . Linaclotide (LINZESS PO) Take by  mouth.    . ondansetron (ZOFRAN) 4 MG tablet Take 1 tablet (4 mg total) by mouth every 8 (eight) hours as needed for nausea or vomiting. 30 tablet 3  . Red Yeast Rice Extract (RED YEAST RICE PO) Take by mouth daily.    . rizatriptan (MAXALT) 10 MG tablet TAKE 1 TABLET (10 MG TOTAL) BY MOUTH AS NEEDED FOR MIGRAINE. MAY REPEAT IN 2 HOURS IF NEEDED 10 tablet 1   No current facility-administered medications for this visit.    Allergies as of 10/24/2015 - Review Complete 10/24/2015  Allergen Reaction Noted  . Epinephrine  08/27/2013  . Lidocaine  08/27/2013    Family History  Problem Relation Age of Onset  . Dystonia Mother   . Breast cancer Mother   . Bone cancer Maternal Grandmother   . Breast cancer Paternal Aunt   . Colon polyps  Mother   . Diabetes Maternal Grandfather   . Heart disease Maternal Grandfather   . Heart disease Paternal Grandmother     Social History   Social History  . Marital Status: Unknown    Spouse Name: N/A  . Number of Children: 1  . Years of Education: N/A   Occupational History  . disabled    Social History Main Topics  . Smoking status: Former Smoker -- 0.00 packs/day    Types: E-cigarettes    Quit date: 02/04/2014  . Smokeless tobacco: Never Used     Comment: no ciggarettes since April - now vaping   . Alcohol Use: No  . Drug Use: No  . Sexual Activity: Not Currently   Other Topics Concern  . Not on file   Social History Narrative     Physical Exam: BP 102/68 mmHg  Pulse 100  Ht 5' 4.75" (1.645 m)  Wt 127 lb 4 oz (57.72 kg)  BMI 21.33 kg/m2 Constitutional: generally well-appearing Psychiatric: alert and oriented x3 Eyes: extraocular movements intact Mouth: oral pharynx moist, no lesions Neck: supple no lymphadenopathy Cardiovascular: heart regular rate and rhythm Lungs: clear to auscultation bilaterally Abdomen: soft, nontender, nondistended, no obvious ascites, no peritoneal signs, normal bowel sounds Extremities: no lower extremity edema bilaterally Skin: no lesions on visible extremities   Assessment and plan: 45 y.o. female with  generalized dystonia, chronic epigastric and lower abdominal pain, chronic constipation, chronic nausea  I suspect her GI symptoms are related to her underlying generalized dystonia or medicines which she is taking because of the dystonia. Nausea vomiting is a common side effect of at least 2 or 3 of her medicines. I wonder if some of her abdominal pains which she described as rock like hardness is related to her dystonia as well because she describes her dystonia muscles as being very rock hard and rigid. She was taking quite a lot of NSAIDs until just the past month or so and so perhaps she has underlying peptic ulcer disease. She  is on chronic narcotic pain medicines which can cause nausea, vomiting, gastric slowing, constipation. I recommended we begin her workup with a CT scan abdomen pelvis with IV and oral contrast. She will also have CBC and complete about profile drawn today. We will begin arranging colonoscopy as well as upper endoscopy for abdominal pains nausea, constipation as well.   Owens Loffler, MD [facility] 10/24/2015, 11:27 AM  Cc: Tresa Garter, MD

## 2015-11-17 NOTE — Discharge Instructions (Signed)

## 2015-11-17 NOTE — Op Note (Signed)
[facility] [place], [address]   COLONOSCOPY PROCEDURE REPORT  PATIENT: Megan Hodges, Megan Hodges  MR#: [id number] BIRTHDATE: 1971/07/13 , 81  yrs. old GENDER: female ENDOSCOPIST: Milus Banister, MD REFERRED [id number] Doreene Burke, M.D. PROCEDURE DATE:  11/17/2015 PROCEDURE:   Colonoscopy, diagnostic First Screening Colonoscopy - Avg.  risk and is 50 yrs.  old or older - No.  Prior Negative Screening - Now for repeat screening. N/A  History of Adenoma - Now for follow-up colonoscopy & has been > or = to 3 yrs.  N/A  Recommend repeat exam, <10 yrs? No ASA CLASS:   Class II INDICATIONS:abdominal pain, constipation. MEDICATIONS: Monitored anesthesia care  DESCRIPTION OF PROCEDURE:   After the risks benefits and alternatives of the procedure were thoroughly explained, informed consent was obtained.  The digital rectal exam revealed no abnormalities of the rectum.   The Pentax Adult Colonscope [id number] endoscope was introduced through the anus and advanced to the terminal ileum which was intubated for a short distance. No adverse events experienced.   The quality of the prep was adequate  The instrument was then slowly withdrawn as the colon was fully examined. Estimated blood loss is zero unless otherwise noted in this procedure report.   COLON FINDINGS: The examined terminal ileum appeared to be normal. A normal appearing cecum, ileocecal valve, and appendiceal orifice were identified.  The ascending, transverse, descending, sigmoid colon, and rectum appeared unremarkable.  Retroflexed views revealed no abnormalities. The time to cecum = 3 min Withdrawal time = 8 min   The scope was withdrawn and the procedure completed. COMPLICATIONS: There were no immediate complications.  ENDOSCOPIC IMPRESSION: 1.   The examined terminal ileum appeared to be normal 2.   Normal colonoscopy  RECOMMENDATIONS: You should continue to follow colorectal cancer screening  guidelines for "routine risk" patients with a repeat colonoscopy in 10 years. There is no need for FOBT (stool) testing for at least 5 years. Your symptoms are likely related to your dystonia or the various meds needed to treat the dystonia (antispasms, narcotics etc)  eSigned:  Milus Banister, MD 11/17/2015 8:03 AM

## 2015-11-17 NOTE — Op Note (Signed)
[facility] [place], [address]   ENDOSCOPY PROCEDURE REPORT  PATIENT: Megan Hodges, Megan Hodges  MR#: [id number] BIRTHDATE: 05-Aug-1971 , 14  yrs. old GENDER: female ENDOSCOPIST: Milus Banister, MD REFERRED BY:  [name], M.D. PROCEDURE DATE:  11/17/2015 PROCEDURE:  EGD w/ biopsy ASA CLASS:     Class II INDICATIONS:  nausea, bloating, abdominal pains. MEDICATIONS: Monitored anesthesia care TOPICAL ANESTHETIC: none  DESCRIPTION OF PROCEDURE: After the risks benefits and alternatives of the procedure were thoroughly explained, informed consent was obtained.  The EG [id number] ) endoscope was introduced through the mouth and advanced to the second portion of the duodenum , Without limitations.  The instrument was slowly withdrawn as the mucosa was fully examined.  There was very mild, non-specific distal gastritis.  The stomach was biopsied (antrum and body) and sent to pathology.  The examination was otherwise normal.  The duodenum was biospied and sent to pathology.  Retroflexed views revealed no abnormalities.     The scope was then withdrawn from the patient and the procedure completed. COMPLICATIONS: There were no immediate complications.  ENDOSCOPIC IMPRESSION: There was very mild, non-specific distal gastritis.  The stomach was biopsied (antrum and body) and sent to pathology.  The examination was otherwise normal.  The duodenum was biospied and sent to pathology  RECOMMENDATIONS: Await final pathology results.  If biopsies show H.  pyori you will be started on appropriate antibiotics.  eSigned:  Milus Banister, MD 11/17/2015 8:07 AM

## 2015-11-18 ENCOUNTER — Encounter (HOSPITAL_COMMUNITY): Payer: Self-pay | Admitting: Gastroenterology

## 2015-11-24 ENCOUNTER — Ambulatory Visit: Payer: Medicare Other | Admitting: Internal Medicine

## 2015-11-30 ENCOUNTER — Ambulatory Visit (INDEPENDENT_AMBULATORY_CARE_PROVIDER_SITE_OTHER): Payer: Medicare Other | Admitting: Internal Medicine

## 2015-11-30 ENCOUNTER — Encounter: Payer: Self-pay | Admitting: Internal Medicine

## 2015-11-30 DIAGNOSIS — E063 Autoimmune thyroiditis: Secondary | ICD-10-CM | POA: Diagnosis not present

## 2015-11-30 LAB — T4, FREE: FREE T4: 0.93 ng/dL (ref 0.60–1.60)

## 2015-11-30 NOTE — Patient Instructions (Signed)
Please stop at the lab.  Please continue Levothyroxine 50 mcg daily.  Take the thyroid hormone every day, with water, at least 30 minutes before breakfast, separated by at least 4 hours from: - acid reflux medications - calcium - iron - multivitamins  Please return in 1 year.

## 2015-11-30 NOTE — Progress Notes (Signed)
Patient ID: Megan Hodges, female   DOB: 14-May-1971, 45 y.o.   MRN: [id number]   HPI  Megan Hodges is a 45 y.o.-year-old female, returning for f/u for hypothyroidism 2/2 Hashimoto's hypothyroidism. Last visit 6 mo ago.  Pt has generalized dystonia and she feels that her L leg again hurts more in the last 2 weeks, just as it hurt at the beginning of her dystonia dx.  She had EGD and colonoscopy >> normal. She still has GI sxs (acid reflux).  Reviewed and addended hx: Pt. has been found to have a low TSH in 06/2013. She tells me that she was not started on thyroid hh as a repeat TSH was normal (however, this was drawn close to her Stalevo dose). Another TSH drawn later returned high (12h after Stalevo), and also, her TPO antibodies returned elevated, confirming Hashimoto's thyroiditis.  We started LT4 25, then increased to 50 mcg daily.  She is taking the LT4: - fasting - in am - with water - >30 min before b'fast - no PPI, MVI, Ca, Fe Not on Cimetidine.  I reviewed pt's thyroid tests:  Lab Results  Component Value Date   TSH 2.92 05/24/2015   TSH 3.84 09/29/2014   TSH 4.60* 06/22/2014   TSH 11.12* 05/17/2014   TSH 12.401* 03/26/2014   FREET4 0.78 05/24/2015   FREET4 0.94 09/29/2014   FREET4 0.84 06/22/2014   FREET4 0.66 05/17/2014  TPO 617 ATA <20 TSI 42 (<140%)  Pt describes: - +++ mm aches and spasms - travelling to [place], will have Deep [facility] Stimulation at [facility] - + fatigue - + poor sleep - + weight gain  - + Hot flushes - + Bloating - + dry skin - +++ depression  She has depression, arthritis, irritability, migraines.  Pt denies feeling nodules in neck, + hoarseness, + dysphagia/no odynophagia, SOB with lying down.  She has + FH of thyroid disorders in: mother (Hashimoto's), aunt with Graves.   For her general dystonia, she is on injections (similar to Botox) for this every 3 mo.She also has HL, anxiety/depression. She is on OCPs.   ROS: Constitutional: see  HPI Eyes: no blurry vision, no xerophthalmia ENT: no sore throat, no nodules palpated in throat, no dysphagia occas./no odynophagia, no hoarseness Cardiovascular: no CP/SOB/no palpitations/leg swelling Respiratory: no cough/SOB Gastrointestinal: + N/+V/+D/+C/+bloating Musculoskeletal: +++ muscle aches and spasms/+ joint aches Skin: no rashes, + itching Neurological: no tremors/numbness/tingling/dizziness, + HA  I reviewed pt's medications, allergies, PMH, social hx, family hx, and changes were documented in the history of present illness. Otherwise, unchanged from my initial visit note.  Past Medical History  Diagnosis Date  . Generalized dystonia     RIDIG TYPE, NOT TREMORS   . Hypercholesteremia   . Hashimoto's disease   . Migraines   . Anxiety   . RA (rheumatoid arthritis) (HCC)     juvenile  . Osteoarthritis   . Fibromyalgia   . GERD (gastroesophageal reflux disease)   . Depression   . Blepharospasm   . Complication of anesthesia     INCREASED HEART RATE WITH C SECTION AND FELT CUTS WITH EMERGENCY C SECTION   Past Surgical History  Procedure Laterality Date  . Cesarean section  1999  . Spine surgery  2005    L5-S1  . Colonoscopy with propofol N/A 11/17/2015    Procedure: COLONOSCOPY WITH PROPOFOL;  Surgeon: Milus Banister, MD;  Location: [facility];  Service: Endoscopy;  Laterality: N/A;  . Esophagogastroduodenoscopy (egd) with propofol N/A  11/17/2015    Procedure: ESOPHAGOGASTRODUODENOSCOPY (EGD) WITH PROPOFOL;  Surgeon: Milus Banister, MD;  Location: [facility];  Service: Endoscopy;  Laterality: N/A;   History   Social History  . Marital Status: single    Spouse Name: N/A    Number of Children: 1: 43 y/o   Occupational History  . disabled   Social History Main Topics  . Smoking status: former Smoker -- 1.00 packs/day - quit 01/2014    Types: E-cigarettes  . Smokeless tobacco: Never Used  . Alcohol Use: No  . Drug Use: No   Current Outpatient  Prescriptions on File Prior to Visit  Medication Sig Dispense Refill  . AbobotulinumtoxinA (DYSPORT) 500 units SOLR injection Inject 1,000 Units into the muscle every 3 (three) months.    . baclofen (LIORESAL) 20 MG tablet Take 20 mg by mouth 4 (four) times daily.    . carbidopa-levodopa-entacapone (STALEVO) 12.5-50-200 MG per tablet Take 1 tablet by mouth 4 (four) times daily.    . Cimetidine (HEARTBURN RELIEF PO) Take 1 tablet by mouth at bedtime.    . dantrolene (DANTRIUM) 50 MG capsule Take 50 mg by mouth 4 (four) times daily.    . diazepam (VALIUM) 10 MG tablet Take 10 mg by mouth 3 (three) times daily.     Marland Kitchen HYDROmorphone (DILAUDID) 2 MG tablet Take 2 mg by mouth every 12 (twelve) hours as needed for moderate pain.     Marland Kitchen HYDROmorphone HCl 16 MG T24A Take 1 tablet by mouth every 12 (twelve) hours.    Marland Kitchen ibuprofen (ADVIL,MOTRIN) 200 MG tablet Take 600-800 mg by mouth every 6 (six) hours as needed for moderate pain.    . Levonorgestrel-Ethinyl Estradiol (AMETHIA,CAMRESE) 0.15-0.03 &0.01 MG tablet TAKE 1 TABLET BY MOUTH DAILY. 91 tablet 0  . levothyroxine (SYNTHROID, LEVOTHROID) 25 MCG tablet TAKE 2 TABLETS (50 MCG TOTAL) BY MOUTH DAILY BEFORE BREAKFAST. 60 tablet 2  . Linaclotide (LINZESS) 145 MCG CAPS capsule Take 145 mcg by mouth at bedtime.    . Na Sulfate-K Sulfate-Mg Sulf SOLN Take 1 kit by mouth once. 354 mL 0  . ondansetron (ZOFRAN) 4 MG tablet Take 1 tablet (4 mg total) by mouth every 8 (eight) hours as needed for nausea or vomiting. 30 tablet 3  . Red Yeast Rice Extract (RED YEAST RICE PO) Take 1 tablet by mouth every morning.     . rizatriptan (MAXALT) 10 MG tablet TAKE 1 TABLET (10 MG TOTAL) BY MOUTH AS NEEDED FOR MIGRAINE. MAY REPEAT IN 2 HOURS IF NEEDED 10 tablet 1   No current facility-administered medications on file prior to visit.   Allergies  Allergen Reactions  . Effexor [Venlafaxine] Nausea And Vomiting    Projectile vomit  . Other     Heart rate went up too high.   Uncoded Allergy. Allergen: LIDOCAINE WITH EPI   FH: see HPI  PE: BP 122/70 mmHg  Pulse 92  Temp(Src) 98.4 F (36.9 C) (Oral)  Resp 12  Wt 125 lb (56.7 kg)  SpO2 97% Body mass index is 20.95 kg/(m^2). Wt Readings from Last 3 Encounters:  11/30/15 125 lb (56.7 kg)  11/17/15 127 lb (57.607 kg)  10/24/15 127 lb 4 oz (57.72 kg)   Constitutional: thin, in distress b/c shoulder/neck mm spasms - she appears uncomfortable  Eyes: PERRLA, EOMI, no exophthalmos ENT: moist mucous membranes, no thyromegaly, no cervical lymphadenopathy Cardiovascular: RRR, No MRG Respiratory: CTA B Gastrointestinal: abdomen soft, NT, ND, BS+ Musculoskeletal: no deformities, strength not tested  2/2 distress Skin: moist, warm, no rashes Neurological: no tremor with outstretched hands  ASSESSMENT: 1. Hypothyroidism - Hashimoto's ds  PLAN:  1. Patient with mild Hashimoto's hypothyroidism, on minimal dose of levothyroxine.  - She does not appear to have a goiter, thyroid nodules, or neck compression symptoms.  - will check thyroid tests in am: TSH, free T4 - advised her to take Levothyroxine EVERY DAY, with water, >30 min before b'fast, separated by >4h from anti acid medication, calcium, iron, MVI. She is taking this correctly - If these are abnormal, she will need to return in 6-8 weeks for repeat labs Return in about 1 year (around 11/29/2016).  Needs refills  - 50 mcg LT4. Office Visit on 11/30/2015  Component Date Value Ref Range Status  . TSH 11/30/2015 3.84  0.35 - 4.50 uIU/mL Final  . Free T4 11/30/2015 0.93  0.60 - 1.60 ng/dL Final   Normal TFTs >> will refill her LT4.

## 2015-12-01 LAB — TSH: TSH: 3.84 u[IU]/mL (ref 0.35–4.50)

## 2015-12-01 MED ORDER — LEVOTHYROXINE SODIUM 50 MCG PO TABS: 50.0000 ug | ORAL_TABLET | Freq: Every day | ORAL | Status: AC

## 2016-01-06 DIAGNOSIS — G243 Spasmodic torticollis: Secondary | ICD-10-CM | POA: Diagnosis not present

## 2016-01-06 DIAGNOSIS — G248 Other dystonia: Secondary | ICD-10-CM | POA: Diagnosis not present

## 2016-01-06 DIAGNOSIS — G245 Blepharospasm: Secondary | ICD-10-CM | POA: Diagnosis not present

## 2016-01-09 DIAGNOSIS — M25512 Pain in left shoulder: Secondary | ICD-10-CM | POA: Diagnosis not present

## 2016-01-09 DIAGNOSIS — M25511 Pain in right shoulder: Secondary | ICD-10-CM | POA: Diagnosis not present

## 2016-01-09 DIAGNOSIS — G241 Genetic torsion dystonia: Secondary | ICD-10-CM | POA: Diagnosis not present

## 2016-01-09 DIAGNOSIS — Z79899 Other long term (current) drug therapy: Secondary | ICD-10-CM | POA: Diagnosis not present

## 2016-01-09 DIAGNOSIS — Z79891 Long term (current) use of opiate analgesic: Secondary | ICD-10-CM | POA: Diagnosis not present

## 2016-01-09 DIAGNOSIS — M25519 Pain in unspecified shoulder: Secondary | ICD-10-CM | POA: Diagnosis not present

## 2016-01-09 DIAGNOSIS — M542 Cervicalgia: Secondary | ICD-10-CM | POA: Diagnosis not present

## 2016-01-12 ENCOUNTER — Other Ambulatory Visit (HOSPITAL_COMMUNITY): Payer: Self-pay | Admitting: Neurology

## 2016-01-12 DIAGNOSIS — M5440 Lumbago with sciatica, unspecified side: Secondary | ICD-10-CM

## 2016-01-20 ENCOUNTER — Ambulatory Visit (HOSPITAL_COMMUNITY)
Admission: RE | Admit: 2016-01-20 | Discharge: 2016-01-20 | Disposition: A | Discharge status: Home / Self Care | Payer: Medicare Other | Source: Ambulatory Visit | Attending: Neurology | Admitting: Neurology

## 2016-01-20 DIAGNOSIS — M5137 Other intervertebral disc degeneration, lumbosacral region: Secondary | ICD-10-CM | POA: Insufficient documentation

## 2016-01-20 DIAGNOSIS — M545 Low back pain: Secondary | ICD-10-CM | POA: Insufficient documentation

## 2016-01-20 DIAGNOSIS — M5126 Other intervertebral disc displacement, lumbar region: Secondary | ICD-10-CM | POA: Diagnosis not present

## 2016-01-20 DIAGNOSIS — M5440 Lumbago with sciatica, unspecified side: Secondary | ICD-10-CM

## 2016-01-29 ENCOUNTER — Other Ambulatory Visit: Payer: Self-pay | Admitting: Internal Medicine

## 2016-02-10 IMAGING — MR MR LUMBAR SPINE WO/W CM
4 of 7 series · 19 of 48 positions shown · IV contrast (multihance)
Comparison: None.

CLINICAL DATA: Low back pain and left leg pain. Lumbago with
sciatica.

EXAM:
MRI LUMBAR SPINE WITHOUT AND WITH CONTRAST
TECHNIQUE: Multiplanar and multiecho pulse sequences of the lumbar spine were
obtained without and with intravenous contrast.
CONTRAST:  10mL MULTIHANCE GADOBENATE DIMEGLUMINE 529 MG/ML IV SOLN

[Series 3: T2 · sagittal · 4.0mm · 0.55mm/px · 3 of 12 slices shown (1 of 2)]
[im 1/12]
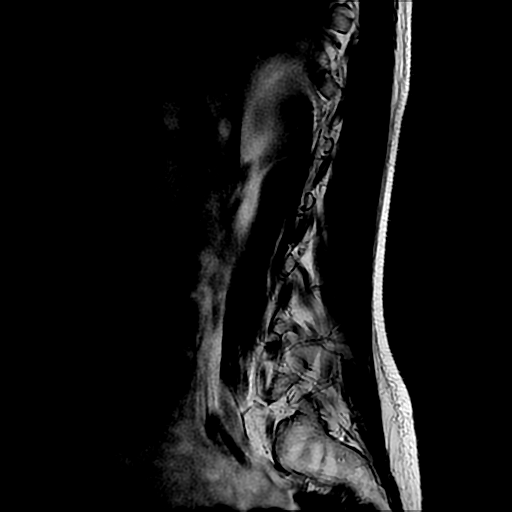
[im 6/12]
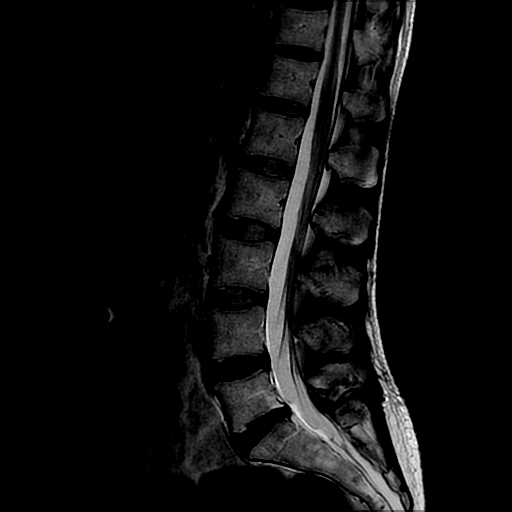
[im 12/12]
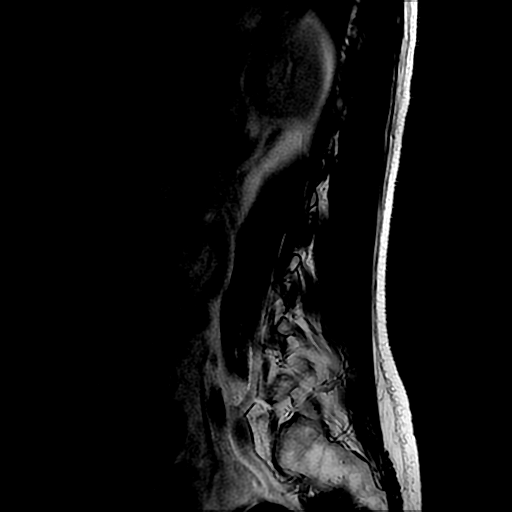

[Series 4: T1 · sagittal · 4.0mm · 0.55mm/px · 3 of 12 slices shown (1 of 2)]
[im 1/12]
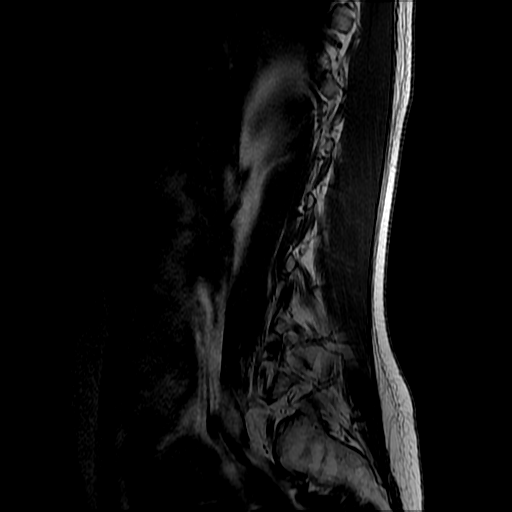
[im 8/12]
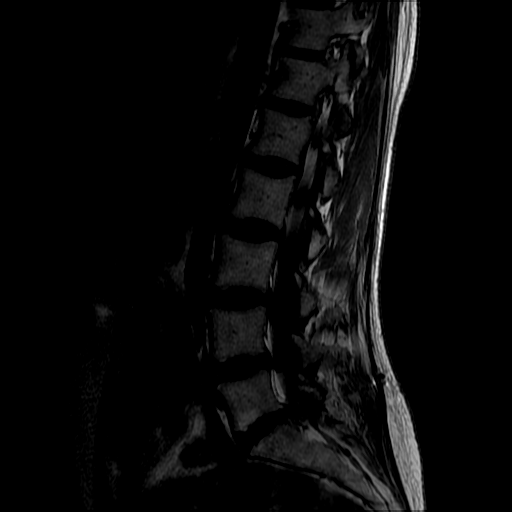
[im 12/12]
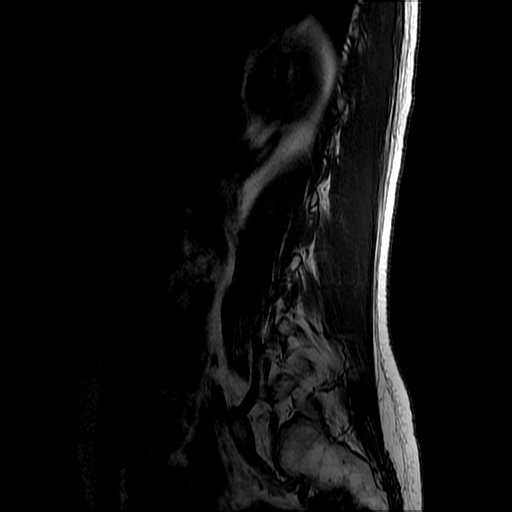

[Series 6: T2 · axial · 4.0mm · 0.39mm/px · z∈[-129,+47]mm · 10 of 34 slices shown (2 of 2)]
[im 1/34]
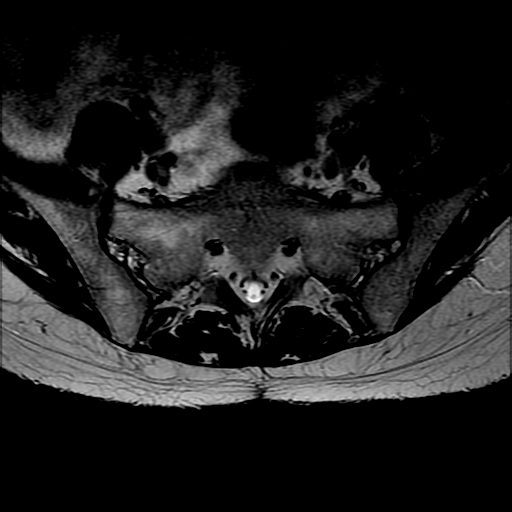
[im 4/34]
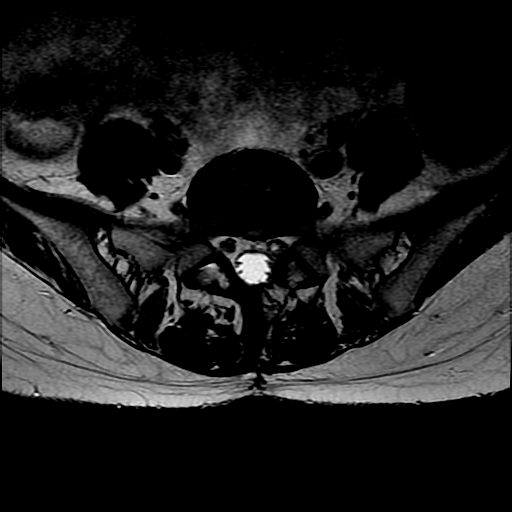
[im 7/34]
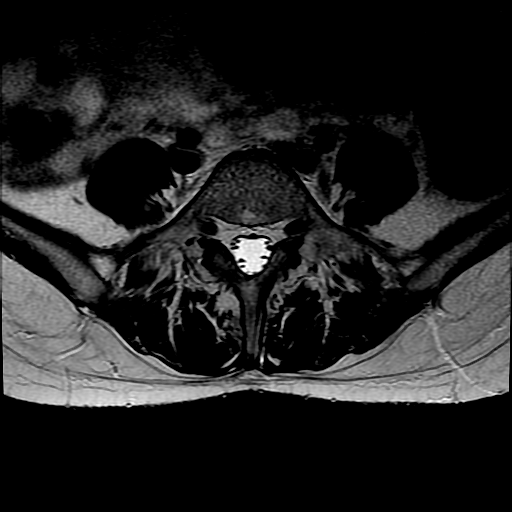
[im 10/34]
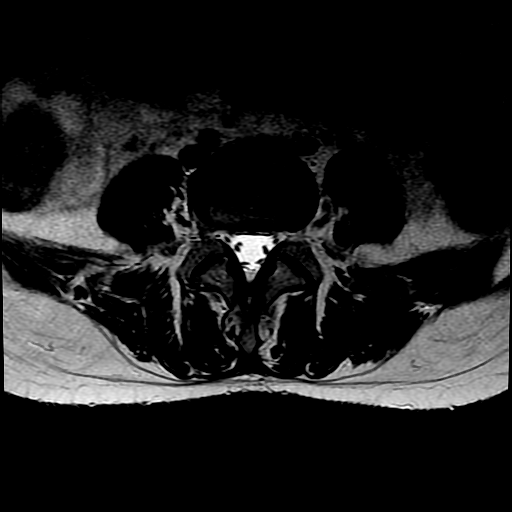
[im 14/34]
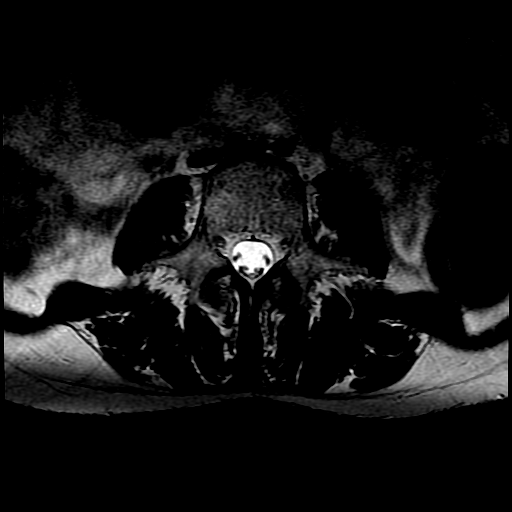
[im 17/34]
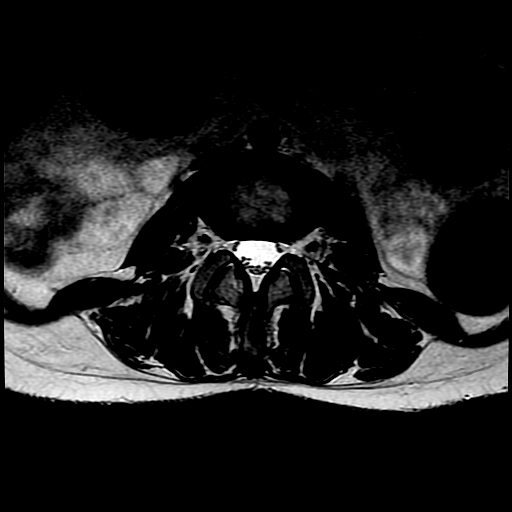
[im 20/34]
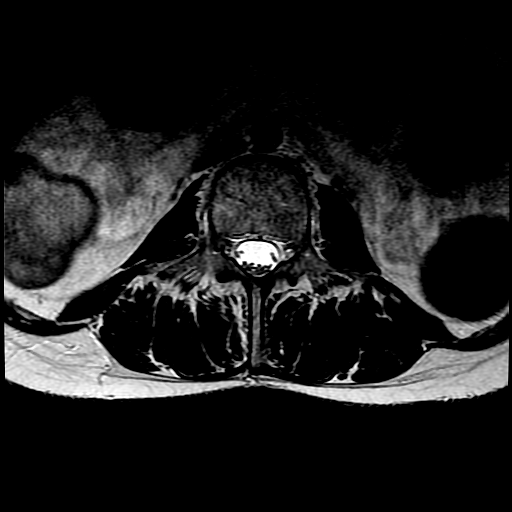
[im 24/34]
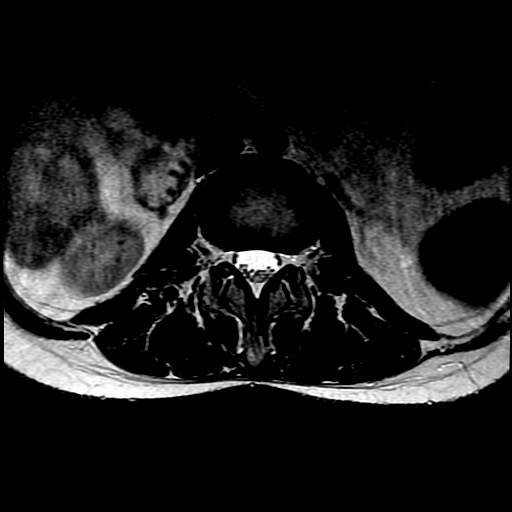
[im 27/34]
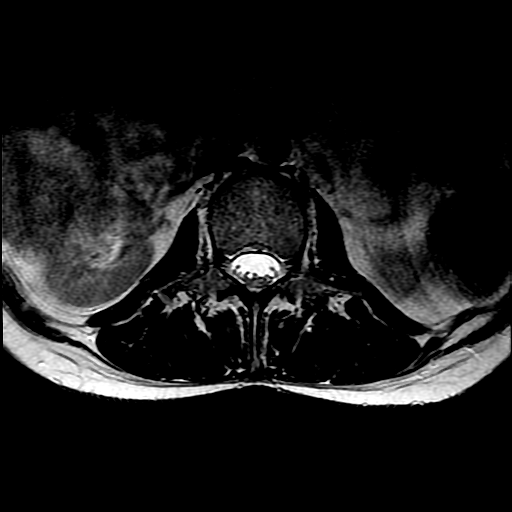
[im 30/34]
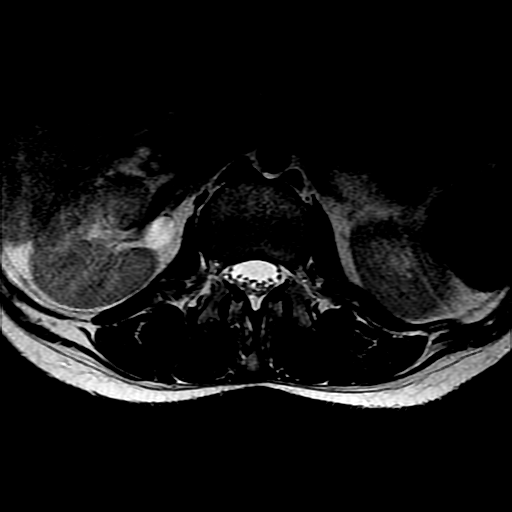

[Series 7: T1 · axial · 4.0mm · 0.39mm/px · z∈[-115,+47]mm · 3 of 34 slices shown (2 of 2)]
[im 4/34]
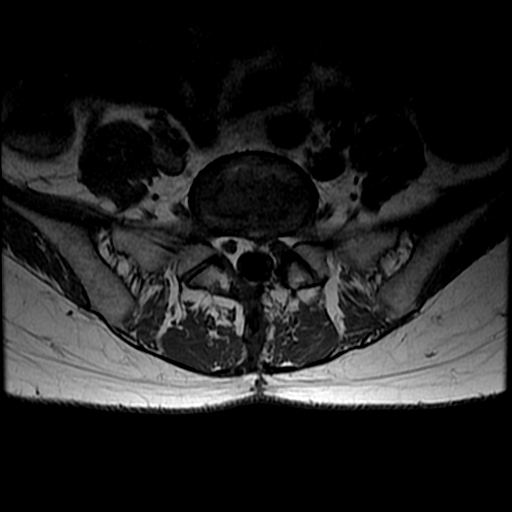
[im 17/34]
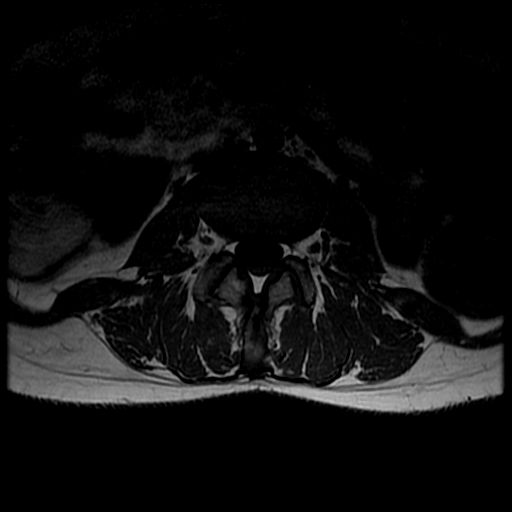
[im 30/34]
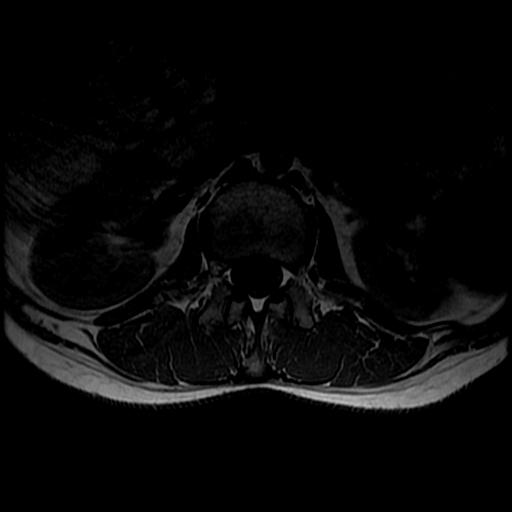

[19 of 48 positions shown; findings below may reference images not displayed]

FINDINGS: Normal conus tip at L1-2.  Normal paraspinal soft tissues.

T11-12 through L3-4:  Normal.

L4-5: Normal disc. Minimal degenerative changes of the facet joints.

L5-S1: Slight disc space narrowing with a tiny broad-based bulge of
the disc without neural impingement. Previous left hemilaminotomy.
No appreciable scarring around the thecal sac or nerve roots. No
pathologic enhancement after contrast administration.
IMPRESSION: [:
IMPRESSION: [
1. Postsurgical changes at L5-S1 on the left with no residual
impingement.
2. Tiny broad-based disc bulge at L5-S1 with no neural impingement.

## 2016-04-13 DIAGNOSIS — G248 Other dystonia: Secondary | ICD-10-CM | POA: Diagnosis not present

## 2016-04-13 DIAGNOSIS — G243 Spasmodic torticollis: Secondary | ICD-10-CM | POA: Diagnosis not present

## 2016-04-13 DIAGNOSIS — G245 Blepharospasm: Secondary | ICD-10-CM | POA: Diagnosis not present

## 2016-04-16 DIAGNOSIS — G248 Other dystonia: Secondary | ICD-10-CM | POA: Diagnosis not present

## 2016-04-16 DIAGNOSIS — Z79899 Other long term (current) drug therapy: Secondary | ICD-10-CM | POA: Diagnosis not present

## 2016-04-16 DIAGNOSIS — M5416 Radiculopathy, lumbar region: Secondary | ICD-10-CM | POA: Diagnosis not present

## 2016-04-16 DIAGNOSIS — R109 Unspecified abdominal pain: Secondary | ICD-10-CM | POA: Diagnosis not present

## 2016-04-16 DIAGNOSIS — M549 Dorsalgia, unspecified: Secondary | ICD-10-CM | POA: Diagnosis not present

## 2016-04-16 DIAGNOSIS — Z79891 Long term (current) use of opiate analgesic: Secondary | ICD-10-CM | POA: Diagnosis not present

## 2016-04-16 DIAGNOSIS — M542 Cervicalgia: Secondary | ICD-10-CM | POA: Diagnosis not present

## 2016-04-16 DIAGNOSIS — M5136 Other intervertebral disc degeneration, lumbar region: Secondary | ICD-10-CM | POA: Diagnosis not present

## 2016-04-16 DIAGNOSIS — G243 Spasmodic torticollis: Secondary | ICD-10-CM | POA: Diagnosis not present

## 2016-04-20 ENCOUNTER — Other Ambulatory Visit: Payer: Self-pay | Admitting: Internal Medicine

## 2016-06-12 DIAGNOSIS — Z79891 Long term (current) use of opiate analgesic: Secondary | ICD-10-CM | POA: Diagnosis not present

## 2016-06-12 DIAGNOSIS — M542 Cervicalgia: Secondary | ICD-10-CM | POA: Diagnosis not present

## 2016-06-12 DIAGNOSIS — G243 Spasmodic torticollis: Secondary | ICD-10-CM | POA: Diagnosis not present

## 2016-06-12 DIAGNOSIS — Z79899 Other long term (current) drug therapy: Secondary | ICD-10-CM | POA: Diagnosis not present

## 2016-06-12 DIAGNOSIS — M791 Myalgia: Secondary | ICD-10-CM | POA: Diagnosis not present

## 2016-06-12 DIAGNOSIS — M25511 Pain in right shoulder: Secondary | ICD-10-CM | POA: Diagnosis not present

## 2016-06-12 DIAGNOSIS — M25512 Pain in left shoulder: Secondary | ICD-10-CM | POA: Diagnosis not present

## 2016-07-02 ENCOUNTER — Other Ambulatory Visit: Payer: Self-pay | Admitting: Internal Medicine

## 2016-07-18 DIAGNOSIS — G245 Blepharospasm: Secondary | ICD-10-CM | POA: Diagnosis not present

## 2016-07-18 DIAGNOSIS — G243 Spasmodic torticollis: Secondary | ICD-10-CM | POA: Diagnosis not present

## 2016-07-18 DIAGNOSIS — G248 Other dystonia: Secondary | ICD-10-CM | POA: Diagnosis not present

## 2016-07-25 DIAGNOSIS — G8929 Other chronic pain: Secondary | ICD-10-CM | POA: Diagnosis not present

## 2016-07-25 DIAGNOSIS — R896 Abnormal cytological findings in specimens from other organs, systems and tissues: Secondary | ICD-10-CM | POA: Diagnosis not present

## 2016-07-25 DIAGNOSIS — H539 Unspecified visual disturbance: Secondary | ICD-10-CM | POA: Diagnosis not present

## 2016-07-25 DIAGNOSIS — G249 Dystonia, unspecified: Secondary | ICD-10-CM | POA: Diagnosis not present

## 2016-07-25 DIAGNOSIS — E039 Hypothyroidism, unspecified: Secondary | ICD-10-CM | POA: Diagnosis not present

## 2016-07-25 DIAGNOSIS — F331 Major depressive disorder, recurrent, moderate: Secondary | ICD-10-CM | POA: Diagnosis not present

## 2016-07-25 DIAGNOSIS — Z1239 Encounter for other screening for malignant neoplasm of breast: Secondary | ICD-10-CM | POA: Diagnosis not present

## 2016-07-30 DIAGNOSIS — R946 Abnormal results of thyroid function studies: Secondary | ICD-10-CM | POA: Diagnosis not present

## 2016-08-06 DIAGNOSIS — H43393 Other vitreous opacities, bilateral: Secondary | ICD-10-CM | POA: Diagnosis not present

## 2016-08-08 DIAGNOSIS — G249 Dystonia, unspecified: Secondary | ICD-10-CM | POA: Diagnosis not present

## 2016-08-08 DIAGNOSIS — F4542 Pain disorder with related psychological factors: Secondary | ICD-10-CM | POA: Diagnosis not present

## 2016-08-08 DIAGNOSIS — M546 Pain in thoracic spine: Secondary | ICD-10-CM | POA: Diagnosis not present

## 2016-08-08 DIAGNOSIS — E039 Hypothyroidism, unspecified: Secondary | ICD-10-CM | POA: Diagnosis not present

## 2016-08-08 DIAGNOSIS — M545 Low back pain: Secondary | ICD-10-CM | POA: Diagnosis not present

## 2016-08-08 DIAGNOSIS — F418 Other specified anxiety disorders: Secondary | ICD-10-CM | POA: Diagnosis not present

## 2016-08-08 DIAGNOSIS — G8929 Other chronic pain: Secondary | ICD-10-CM | POA: Diagnosis not present

## 2016-08-14 DIAGNOSIS — Z1231 Encounter for screening mammogram for malignant neoplasm of breast: Secondary | ICD-10-CM | POA: Diagnosis not present

## 2016-08-28 DIAGNOSIS — F418 Other specified anxiety disorders: Secondary | ICD-10-CM | POA: Diagnosis not present

## 2016-09-04 DIAGNOSIS — F331 Major depressive disorder, recurrent, moderate: Secondary | ICD-10-CM | POA: Diagnosis not present

## 2016-09-10 DIAGNOSIS — Z79891 Long term (current) use of opiate analgesic: Secondary | ICD-10-CM | POA: Diagnosis not present

## 2016-09-10 DIAGNOSIS — G243 Spasmodic torticollis: Secondary | ICD-10-CM | POA: Diagnosis not present

## 2016-09-10 DIAGNOSIS — M791 Myalgia: Secondary | ICD-10-CM | POA: Diagnosis not present

## 2016-09-10 DIAGNOSIS — M542 Cervicalgia: Secondary | ICD-10-CM | POA: Diagnosis not present

## 2016-09-10 DIAGNOSIS — Z79899 Other long term (current) drug therapy: Secondary | ICD-10-CM | POA: Diagnosis not present

## 2016-09-10 DIAGNOSIS — M25511 Pain in right shoulder: Secondary | ICD-10-CM | POA: Diagnosis not present

## 2016-09-10 DIAGNOSIS — M25512 Pain in left shoulder: Secondary | ICD-10-CM | POA: Diagnosis not present

## 2016-09-11 DIAGNOSIS — F418 Other specified anxiety disorders: Secondary | ICD-10-CM | POA: Diagnosis not present

## 2016-09-18 DIAGNOSIS — F418 Other specified anxiety disorders: Secondary | ICD-10-CM | POA: Diagnosis not present

## 2016-10-06 ENCOUNTER — Other Ambulatory Visit: Payer: Self-pay | Admitting: Internal Medicine

## 2016-10-11 DIAGNOSIS — G243 Spasmodic torticollis: Secondary | ICD-10-CM | POA: Diagnosis not present

## 2016-10-11 DIAGNOSIS — Z79899 Other long term (current) drug therapy: Secondary | ICD-10-CM | POA: Diagnosis not present

## 2016-10-11 DIAGNOSIS — M542 Cervicalgia: Secondary | ICD-10-CM | POA: Diagnosis not present

## 2016-10-11 DIAGNOSIS — G248 Other dystonia: Secondary | ICD-10-CM | POA: Diagnosis not present

## 2016-10-11 DIAGNOSIS — Z79891 Long term (current) use of opiate analgesic: Secondary | ICD-10-CM | POA: Diagnosis not present

## 2016-10-11 DIAGNOSIS — M791 Myalgia: Secondary | ICD-10-CM | POA: Diagnosis not present

## 2016-10-11 DIAGNOSIS — M25511 Pain in right shoulder: Secondary | ICD-10-CM | POA: Diagnosis not present

## 2016-10-11 DIAGNOSIS — M25512 Pain in left shoulder: Secondary | ICD-10-CM | POA: Diagnosis not present

## 2016-10-19 DIAGNOSIS — R21 Rash and other nonspecific skin eruption: Secondary | ICD-10-CM | POA: Diagnosis not present

## 2016-10-19 DIAGNOSIS — G8929 Other chronic pain: Secondary | ICD-10-CM | POA: Diagnosis not present

## 2016-10-19 DIAGNOSIS — G249 Dystonia, unspecified: Secondary | ICD-10-CM | POA: Diagnosis not present

## 2016-10-19 DIAGNOSIS — E039 Hypothyroidism, unspecified: Secondary | ICD-10-CM | POA: Diagnosis not present

## 2016-10-19 DIAGNOSIS — F331 Major depressive disorder, recurrent, moderate: Secondary | ICD-10-CM | POA: Diagnosis not present

## 2016-10-26 DIAGNOSIS — G245 Blepharospasm: Secondary | ICD-10-CM | POA: Diagnosis not present

## 2016-10-26 DIAGNOSIS — G248 Other dystonia: Secondary | ICD-10-CM | POA: Diagnosis not present

## 2016-10-26 DIAGNOSIS — G243 Spasmodic torticollis: Secondary | ICD-10-CM | POA: Diagnosis not present

## 2016-10-31 DIAGNOSIS — G249 Dystonia, unspecified: Secondary | ICD-10-CM | POA: Diagnosis not present

## 2016-10-31 DIAGNOSIS — M5116 Intervertebral disc disorders with radiculopathy, lumbar region: Secondary | ICD-10-CM | POA: Diagnosis not present

## 2016-10-31 DIAGNOSIS — M79605 Pain in left leg: Secondary | ICD-10-CM | POA: Diagnosis not present

## 2016-11-29 ENCOUNTER — Ambulatory Visit: Payer: Medicare Other | Admitting: Internal Medicine

## 2016-12-04 DIAGNOSIS — M961 Postlaminectomy syndrome, not elsewhere classified: Secondary | ICD-10-CM | POA: Diagnosis not present

## 2016-12-04 DIAGNOSIS — Z79899 Other long term (current) drug therapy: Secondary | ICD-10-CM | POA: Diagnosis not present

## 2016-12-04 DIAGNOSIS — G249 Dystonia, unspecified: Secondary | ICD-10-CM | POA: Diagnosis not present

## 2016-12-04 DIAGNOSIS — Z79891 Long term (current) use of opiate analgesic: Secondary | ICD-10-CM | POA: Diagnosis not present

## 2016-12-04 DIAGNOSIS — M5117 Intervertebral disc disorders with radiculopathy, lumbosacral region: Secondary | ICD-10-CM | POA: Diagnosis not present

## 2016-12-04 DIAGNOSIS — M5116 Intervertebral disc disorders with radiculopathy, lumbar region: Secondary | ICD-10-CM | POA: Diagnosis not present

## 2016-12-04 DIAGNOSIS — M79605 Pain in left leg: Secondary | ICD-10-CM | POA: Diagnosis not present

## 2016-12-04 DIAGNOSIS — M549 Dorsalgia, unspecified: Secondary | ICD-10-CM | POA: Diagnosis not present

## 2016-12-20 DIAGNOSIS — M791 Myalgia: Secondary | ICD-10-CM | POA: Diagnosis not present

## 2016-12-20 DIAGNOSIS — M25512 Pain in left shoulder: Secondary | ICD-10-CM | POA: Diagnosis not present

## 2016-12-20 DIAGNOSIS — M542 Cervicalgia: Secondary | ICD-10-CM | POA: Diagnosis not present

## 2016-12-20 DIAGNOSIS — G243 Spasmodic torticollis: Secondary | ICD-10-CM | POA: Diagnosis not present

## 2016-12-20 DIAGNOSIS — M25511 Pain in right shoulder: Secondary | ICD-10-CM | POA: Diagnosis not present

## 2016-12-20 DIAGNOSIS — Z79891 Long term (current) use of opiate analgesic: Secondary | ICD-10-CM | POA: Diagnosis not present

## 2016-12-20 DIAGNOSIS — G248 Other dystonia: Secondary | ICD-10-CM | POA: Diagnosis not present

## 2016-12-20 DIAGNOSIS — Z79899 Other long term (current) drug therapy: Secondary | ICD-10-CM | POA: Diagnosis not present

## 2016-12-30 ENCOUNTER — Other Ambulatory Visit: Payer: Self-pay | Admitting: Internal Medicine

## 2017-01-09 DIAGNOSIS — Z79899 Other long term (current) drug therapy: Secondary | ICD-10-CM | POA: Diagnosis not present

## 2017-01-09 DIAGNOSIS — Z79891 Long term (current) use of opiate analgesic: Secondary | ICD-10-CM | POA: Diagnosis not present

## 2017-01-09 DIAGNOSIS — M5117 Intervertebral disc disorders with radiculopathy, lumbosacral region: Secondary | ICD-10-CM | POA: Diagnosis not present

## 2017-01-18 DIAGNOSIS — M79605 Pain in left leg: Secondary | ICD-10-CM | POA: Diagnosis not present

## 2017-01-18 DIAGNOSIS — Z9889 Other specified postprocedural states: Secondary | ICD-10-CM | POA: Diagnosis not present

## 2017-01-22 DIAGNOSIS — F418 Other specified anxiety disorders: Secondary | ICD-10-CM | POA: Diagnosis not present

## 2017-01-22 DIAGNOSIS — L853 Xerosis cutis: Secondary | ICD-10-CM | POA: Diagnosis not present

## 2017-01-22 DIAGNOSIS — R140 Abdominal distension (gaseous): Secondary | ICD-10-CM | POA: Diagnosis not present

## 2017-01-22 DIAGNOSIS — L989 Disorder of the skin and subcutaneous tissue, unspecified: Secondary | ICD-10-CM | POA: Diagnosis not present

## 2017-01-31 DIAGNOSIS — F331 Major depressive disorder, recurrent, moderate: Secondary | ICD-10-CM | POA: Diagnosis not present

## 2017-02-01 DIAGNOSIS — G245 Blepharospasm: Secondary | ICD-10-CM | POA: Diagnosis not present

## 2017-02-01 DIAGNOSIS — G248 Other dystonia: Secondary | ICD-10-CM | POA: Diagnosis not present

## 2017-02-01 DIAGNOSIS — G243 Spasmodic torticollis: Secondary | ICD-10-CM | POA: Diagnosis not present

## 2017-02-05 DIAGNOSIS — L859 Epidermal thickening, unspecified: Secondary | ICD-10-CM | POA: Diagnosis not present

## 2017-02-05 DIAGNOSIS — L989 Disorder of the skin and subcutaneous tissue, unspecified: Secondary | ICD-10-CM | POA: Diagnosis not present

## 2017-02-11 DIAGNOSIS — G248 Other dystonia: Secondary | ICD-10-CM | POA: Diagnosis not present

## 2017-02-11 DIAGNOSIS — M5136 Other intervertebral disc degeneration, lumbar region: Secondary | ICD-10-CM | POA: Diagnosis not present

## 2017-02-11 DIAGNOSIS — Z79891 Long term (current) use of opiate analgesic: Secondary | ICD-10-CM | POA: Diagnosis not present

## 2017-02-11 DIAGNOSIS — Z79899 Other long term (current) drug therapy: Secondary | ICD-10-CM | POA: Diagnosis not present

## 2017-02-11 DIAGNOSIS — G894 Chronic pain syndrome: Secondary | ICD-10-CM | POA: Diagnosis not present

## 2017-04-17 DIAGNOSIS — G894 Chronic pain syndrome: Secondary | ICD-10-CM | POA: Diagnosis not present

## 2017-04-17 DIAGNOSIS — Z79891 Long term (current) use of opiate analgesic: Secondary | ICD-10-CM | POA: Diagnosis not present

## 2017-04-17 DIAGNOSIS — M5136 Other intervertebral disc degeneration, lumbar region: Secondary | ICD-10-CM | POA: Diagnosis not present

## 2017-04-17 DIAGNOSIS — M79605 Pain in left leg: Secondary | ICD-10-CM | POA: Diagnosis not present

## 2017-04-17 DIAGNOSIS — Z79899 Other long term (current) drug therapy: Secondary | ICD-10-CM | POA: Diagnosis not present

## 2017-05-03 DIAGNOSIS — G243 Spasmodic torticollis: Secondary | ICD-10-CM | POA: Diagnosis not present

## 2017-05-03 DIAGNOSIS — G245 Blepharospasm: Secondary | ICD-10-CM | POA: Diagnosis not present

## 2017-05-03 DIAGNOSIS — G248 Other dystonia: Secondary | ICD-10-CM | POA: Diagnosis not present

## 2017-06-17 DIAGNOSIS — M5136 Other intervertebral disc degeneration, lumbar region: Secondary | ICD-10-CM | POA: Diagnosis not present

## 2017-06-17 DIAGNOSIS — G249 Dystonia, unspecified: Secondary | ICD-10-CM | POA: Diagnosis not present

## 2017-06-17 DIAGNOSIS — M79605 Pain in left leg: Secondary | ICD-10-CM | POA: Diagnosis not present

## 2017-06-17 DIAGNOSIS — Z79899 Other long term (current) drug therapy: Secondary | ICD-10-CM | POA: Diagnosis not present

## 2017-06-17 DIAGNOSIS — Z79891 Long term (current) use of opiate analgesic: Secondary | ICD-10-CM | POA: Diagnosis not present

## 2017-06-17 DIAGNOSIS — G894 Chronic pain syndrome: Secondary | ICD-10-CM | POA: Diagnosis not present

## 2017-06-17 DIAGNOSIS — G248 Other dystonia: Secondary | ICD-10-CM | POA: Diagnosis not present

## 2017-08-07 DIAGNOSIS — G248 Other dystonia: Secondary | ICD-10-CM | POA: Diagnosis not present

## 2017-08-07 DIAGNOSIS — G245 Blepharospasm: Secondary | ICD-10-CM | POA: Diagnosis not present

## 2017-08-07 DIAGNOSIS — G243 Spasmodic torticollis: Secondary | ICD-10-CM | POA: Diagnosis not present

## 2017-08-12 DIAGNOSIS — G894 Chronic pain syndrome: Secondary | ICD-10-CM | POA: Diagnosis not present

## 2017-08-12 DIAGNOSIS — M791 Myalgia, unspecified site: Secondary | ICD-10-CM | POA: Diagnosis not present

## 2017-08-12 DIAGNOSIS — M5136 Other intervertebral disc degeneration, lumbar region: Secondary | ICD-10-CM | POA: Diagnosis not present

## 2017-08-12 DIAGNOSIS — M79605 Pain in left leg: Secondary | ICD-10-CM | POA: Diagnosis not present

## 2017-08-12 DIAGNOSIS — Z79891 Long term (current) use of opiate analgesic: Secondary | ICD-10-CM | POA: Diagnosis not present

## 2017-08-12 DIAGNOSIS — G249 Dystonia, unspecified: Secondary | ICD-10-CM | POA: Diagnosis not present

## 2017-08-28 DIAGNOSIS — R140 Abdominal distension (gaseous): Secondary | ICD-10-CM | POA: Diagnosis not present

## 2017-08-28 DIAGNOSIS — F331 Major depressive disorder, recurrent, moderate: Secondary | ICD-10-CM | POA: Diagnosis not present

## 2017-08-28 DIAGNOSIS — F418 Other specified anxiety disorders: Secondary | ICD-10-CM | POA: Diagnosis not present

## 2017-08-28 DIAGNOSIS — G249 Dystonia, unspecified: Secondary | ICD-10-CM | POA: Diagnosis not present

## 2017-08-28 DIAGNOSIS — R896 Abnormal cytological findings in specimens from other organs, systems and tissues: Secondary | ICD-10-CM | POA: Diagnosis not present

## 2017-09-26 DIAGNOSIS — F22 Delusional disorders: Secondary | ICD-10-CM | POA: Diagnosis not present

## 2017-09-26 DIAGNOSIS — R140 Abdominal distension (gaseous): Secondary | ICD-10-CM | POA: Diagnosis not present

## 2017-09-26 DIAGNOSIS — R0789 Other chest pain: Secondary | ICD-10-CM | POA: Diagnosis not present
